# Patient Record
Sex: Female | Born: 1955
Health system: Southern US, Community
[De-identification: ages and names within clinical notes are randomized; demographics above are authoritative.]

## PROBLEM LIST (undated history)

## (undated) DIAGNOSIS — M1990 Unspecified osteoarthritis, unspecified site: Secondary | ICD-10-CM

## (undated) DIAGNOSIS — R079 Chest pain, unspecified: Secondary | ICD-10-CM

## (undated) DIAGNOSIS — E7800 Pure hypercholesterolemia, unspecified: Secondary | ICD-10-CM

## (undated) DIAGNOSIS — Z973 Presence of spectacles and contact lenses: Secondary | ICD-10-CM

## (undated) DIAGNOSIS — K5792 Diverticulitis of intestine, part unspecified, without perforation or abscess without bleeding: Secondary | ICD-10-CM

## (undated) DIAGNOSIS — Z87440 Personal history of urinary (tract) infections: Secondary | ICD-10-CM

## (undated) DIAGNOSIS — I10 Essential (primary) hypertension: Secondary | ICD-10-CM

## (undated) DIAGNOSIS — E785 Hyperlipidemia, unspecified: Secondary | ICD-10-CM

## (undated) HISTORY — DX: Hyperlipidemia, unspecified: E78.5

## (undated) HISTORY — DX: Diverticulitis of intestine, part unspecified, without perforation or abscess without bleeding: K57.92

## (undated) HISTORY — DX: Essential (primary) hypertension: I10

## (undated) HISTORY — PX: FOOT SURGERY: SHX648

## (undated) HISTORY — DX: Chest pain, unspecified: R07.9

## (undated) HISTORY — PX: TONSILLECTOMY: SUR1361

## (undated) HISTORY — DX: Pure hypercholesterolemia, unspecified: E78.00

---

## 2008-07-15 HISTORY — PX: COLONOSCOPY: SHX174

## 2011-12-29 ENCOUNTER — Other Ambulatory Visit (HOSPITAL_COMMUNITY): Payer: Self-pay | Admitting: Internal Medicine

## 2011-12-29 DIAGNOSIS — Z1239 Encounter for other screening for malignant neoplasm of breast: Secondary | ICD-10-CM

## 2012-01-01 ENCOUNTER — Ambulatory Visit (HOSPITAL_COMMUNITY)
Admission: RE | Admit: 2012-01-01 | Discharge: 2012-01-01 | Disposition: A | Discharge status: Home / Self Care | Payer: 59 | Source: Ambulatory Visit | Attending: Internal Medicine | Admitting: Internal Medicine

## 2012-01-01 DIAGNOSIS — Z1231 Encounter for screening mammogram for malignant neoplasm of breast: Secondary | ICD-10-CM | POA: Insufficient documentation

## 2012-01-01 DIAGNOSIS — Z1239 Encounter for other screening for malignant neoplasm of breast: Secondary | ICD-10-CM

## 2012-12-29 ENCOUNTER — Other Ambulatory Visit (HOSPITAL_COMMUNITY): Payer: Self-pay | Admitting: Internal Medicine

## 2012-12-29 DIAGNOSIS — Z139 Encounter for screening, unspecified: Secondary | ICD-10-CM

## 2013-01-06 ENCOUNTER — Ambulatory Visit (HOSPITAL_COMMUNITY)
Admission: RE | Admit: 2013-01-06 | Discharge: 2013-01-06 | Disposition: A | Discharge status: Home / Self Care | Payer: BC Managed Care – PPO | Source: Ambulatory Visit | Attending: Internal Medicine | Admitting: Internal Medicine

## 2013-01-06 DIAGNOSIS — Z1231 Encounter for screening mammogram for malignant neoplasm of breast: Secondary | ICD-10-CM | POA: Insufficient documentation

## 2013-01-06 DIAGNOSIS — Z139 Encounter for screening, unspecified: Secondary | ICD-10-CM

## 2013-01-11 ENCOUNTER — Other Ambulatory Visit: Payer: Self-pay | Admitting: Internal Medicine

## 2013-01-11 DIAGNOSIS — R928 Other abnormal and inconclusive findings on diagnostic imaging of breast: Secondary | ICD-10-CM

## 2013-02-02 ENCOUNTER — Ambulatory Visit (HOSPITAL_COMMUNITY)
Admission: RE | Admit: 2013-02-02 | Discharge: 2013-02-02 | Disposition: A | Discharge status: Home / Self Care | Payer: BC Managed Care – PPO | Source: Ambulatory Visit | Attending: Internal Medicine | Admitting: Internal Medicine

## 2013-02-02 DIAGNOSIS — R928 Other abnormal and inconclusive findings on diagnostic imaging of breast: Secondary | ICD-10-CM | POA: Insufficient documentation

## 2013-06-13 ENCOUNTER — Encounter (HOSPITAL_COMMUNITY): Payer: Self-pay

## 2013-06-13 ENCOUNTER — Other Ambulatory Visit (HOSPITAL_COMMUNITY): Payer: Self-pay | Admitting: Internal Medicine

## 2013-06-13 ENCOUNTER — Ambulatory Visit (HOSPITAL_COMMUNITY)
Admission: RE | Admit: 2013-06-13 | Discharge: 2013-06-13 | Disposition: A | Discharge status: Home / Self Care | Payer: 59 | Source: Ambulatory Visit | Attending: Internal Medicine | Admitting: Internal Medicine

## 2013-06-13 DIAGNOSIS — R110 Nausea: Secondary | ICD-10-CM | POA: Insufficient documentation

## 2013-06-13 DIAGNOSIS — R109 Unspecified abdominal pain: Secondary | ICD-10-CM | POA: Insufficient documentation

## 2013-06-13 DIAGNOSIS — K5732 Diverticulitis of large intestine without perforation or abscess without bleeding: Secondary | ICD-10-CM | POA: Insufficient documentation

## 2013-06-13 HISTORY — DX: Essential (primary) hypertension: I10

## 2013-06-13 MED ORDER — IOHEXOL 300 MG/ML  SOLN
100.0000 mL | Freq: Once | INTRAMUSCULAR | Status: AC | PRN
  Administered 2013-06-13: 100 mL via INTRAVENOUS

## 2013-08-22 ENCOUNTER — Other Ambulatory Visit (HOSPITAL_COMMUNITY): Payer: Self-pay | Admitting: Internal Medicine

## 2013-08-22 DIAGNOSIS — R928 Other abnormal and inconclusive findings on diagnostic imaging of breast: Secondary | ICD-10-CM

## 2013-08-24 ENCOUNTER — Ambulatory Visit (HOSPITAL_COMMUNITY)
Admission: RE | Admit: 2013-08-24 | Discharge: 2013-08-24 | Disposition: A | Discharge status: Home / Self Care | Payer: 59 | Source: Ambulatory Visit | Attending: Internal Medicine | Admitting: Internal Medicine

## 2013-08-24 DIAGNOSIS — R928 Other abnormal and inconclusive findings on diagnostic imaging of breast: Secondary | ICD-10-CM | POA: Insufficient documentation

## 2014-01-09 ENCOUNTER — Other Ambulatory Visit (HOSPITAL_COMMUNITY): Payer: Self-pay | Admitting: Internal Medicine

## 2014-01-09 DIAGNOSIS — Z09 Encounter for follow-up examination after completed treatment for conditions other than malignant neoplasm: Secondary | ICD-10-CM

## 2014-02-07 ENCOUNTER — Ambulatory Visit (HOSPITAL_COMMUNITY)
Admission: RE | Admit: 2014-02-07 | Discharge: 2014-02-07 | Disposition: A | Discharge status: Home / Self Care | Payer: 59 | Source: Ambulatory Visit | Attending: Internal Medicine | Admitting: Internal Medicine

## 2014-02-07 DIAGNOSIS — Z09 Encounter for follow-up examination after completed treatment for conditions other than malignant neoplasm: Secondary | ICD-10-CM

## 2014-02-07 DIAGNOSIS — R928 Other abnormal and inconclusive findings on diagnostic imaging of breast: Secondary | ICD-10-CM | POA: Insufficient documentation

## 2014-10-16 ENCOUNTER — Encounter: Payer: Self-pay | Admitting: Internal Medicine

## 2014-11-09 ENCOUNTER — Ambulatory Visit (INDEPENDENT_AMBULATORY_CARE_PROVIDER_SITE_OTHER): Payer: 59 | Admitting: Gastroenterology

## 2014-11-09 ENCOUNTER — Encounter: Payer: Self-pay | Admitting: Gastroenterology

## 2014-11-09 ENCOUNTER — Other Ambulatory Visit: Payer: Self-pay

## 2014-11-09 ENCOUNTER — Telehealth: Payer: Self-pay | Admitting: Internal Medicine

## 2014-11-09 VITALS — BP 125/75 | HR 75 | Temp 98.4°F | Ht 62.0 in | Wt 136.0 lb

## 2014-11-09 DIAGNOSIS — K5732 Diverticulitis of large intestine without perforation or abscess without bleeding: Secondary | ICD-10-CM

## 2014-11-09 DIAGNOSIS — Z8719 Personal history of other diseases of the digestive system: Secondary | ICD-10-CM

## 2014-11-09 DIAGNOSIS — R140 Abdominal distension (gaseous): Secondary | ICD-10-CM | POA: Diagnosis not present

## 2014-11-09 MED ORDER — PEG 3350-KCL-NA BICARB-NACL 420 G PO SOLR: 4000.0000 mL | Freq: Once | ORAL | Status: DC

## 2014-11-09 NOTE — Telephone Encounter (Signed)
Patient was seen today by AS and AS had requested for me to get last colonoscopy report from Dr Jodelle Green in [place] which was done in May 2010. I faxed release and they faxed a reply back that records were too old and off site.

## 2014-11-09 NOTE — Progress Notes (Signed)
Primary Care Physician:  Delphina Cahill, MD Primary Gastroenterologist:  Dr. Gala Romney   Chief Complaint  Patient presents with  . Diverticulitis    HPI:   Elizabeth Li is a 59 y.o. female presenting today at the request of Dr. Nevada Crane secondary to history of diverticulitis.  Notes occasional RLQ discomfort but not doubled over like she was before. Last year March 2015 felt horrible, took Copywriter, advertising, ambien, ibuprofen. Pain continued, so CT was ordered by PCP. CT revealed acute diverticulitis of mid descending colon. Always feels bloated. Feels like she has a bump in lower abdomen. [facility] Started protein shakes in the morning to try and decrease feeling of bloating. History of chronic constipation. Makes a concoction of prunes, raisins, senna powder, with good results every day but has to take every day. Doesn't want to try a prescription medication. Last colonoscopy May 2010 in [place]. Possible polyps removed at that time. No FH of colon cancer. No rectal bleeding. No unexplained weight loss, lack of appetite.  Past Medical History  Diagnosis Date  . Hypertension   . Diverticulitis   . Hypercholesterolemia   . Anxiety     Past Surgical History  Procedure Laterality Date  . Colonoscopy  07/15/2008    polyps-done in [place]    Current Outpatient Prescriptions  Medication Sig Dispense Refill  . FLUoxetine (PROZAC) 20 MG capsule Take 20 mg by mouth daily.    Marland Kitchen losartan (COZAAR) 100 MG tablet Take 100 mg by mouth daily.    . simvastatin (ZOCOR) 40 MG tablet Take 40 mg by mouth daily.     No current facility-administered medications for this visit.    Allergies as of 11/09/2014  . (No Known Allergies)    Family History  Problem Relation Age of Onset  . Colon cancer Neg Hx     Social History   Social History  . Marital Status: Married    Spouse Name: N/A  . Number of Children: N/A  . Years of Education: N/A    Occupational History  . [employer]   Social History Main Topics  . Smoking status: Never Smoker   . Smokeless tobacco: Not on file  . Alcohol Use: 0.0 oz/week    0 Standard drinks or equivalent per week     Comment: occ  . Drug Use: No  . Sexual Activity: Not on file   Other Topics Concern  . Not on file   Social History Narrative    Review of Systems: Gen: Denies any fever, chills, fatigue, weight loss, lack of appetite.  CV: Denies chest pain, heart palpitations, peripheral edema, syncope.  Resp: Denies shortness of breath at rest or with exertion. Denies wheezing or cough.  GI: As mentioned in HPI GU : Denies urinary burning, urinary frequency, urinary hesitancy MS: Denies joint pain, muscle weakness, cramps, or limitation of movement.  Derm: Denies rash, itching, dry skin Psych: Denies depression, anxiety, memory loss, and confusion Heme: Denies bruising, bleeding, and enlarged lymph nodes.  Physical Exam: BP 125/75 mmHg  Pulse 75  Temp(Src) 98.4 F (36.9 C) (Oral)  Ht 5\' 2"  (1.575 m)  Wt 136 lb (61.689 kg)  BMI 24.87 kg/m2 General:   Alert and oriented. Pleasant and cooperative. Well-nourished and well-developed.  Head:  Normocephalic and atraumatic. Eyes:  Without icterus, sclera clear and conjunctiva pink.  Ears:  Normal auditory acuity. Nose:  No  deformity, discharge,  or lesions. Mouth:  No deformity or lesions, oral mucosa pink.  Lungs:  Clear to auscultation bilaterally. No wheezes, rales, or rhonchi. No distress.  Heart:  S1, S2 present without murmurs appreciated.  Abdomen:  +BS, soft, non-tender and non-distended. No HSM noted. No guarding or rebound. No masses appreciated.  Rectal:  Deferred  Msk:  Symmetrical without gross deformities. Normal posture. Extremities:  Without  edema. Neurologic:  Alert and  oriented x4;  grossly normal neurologically. Skin:  Intact without significant lesions or rashes. Psych:  Alert and  cooperative. Normal mood and affect.

## 2014-11-09 NOTE — Patient Instructions (Addendum)
We have scheduled you for a colonoscopy with Dr. Gala Romney in the near future.  Start taking a probiotic daily such as Align, [place], [place], Digestive Advantage. I have provided samples.  Please review the bloating handout. Try to eliminate some of these items to see if anything makes a difference.  I am glad you are seeing the gynecologist next month. Bloating Bloating is the feeling of fullness in your belly. You may feel as though your pants are too tight. Often the cause of bloating is overeating, retaining fluids, or having gas in your bowel. It is also caused by swallowing air and eating foods that cause gas. Irritable bowel syndrome is one of the most common causes of bloating. Constipation is also a common cause. Sometimes more serious problems can cause bloating. SYMPTOMS  Usually there is a feeling of fullness, as though your abdomen is bulged out. There may be mild discomfort.  DIAGNOSIS  Usually no particular testing is necessary for most bloating. If the condition persists and seems to become worse, your caregiver may do additional testing.  TREATMENT   There is no direct treatment for bloating.  Do not put gas into the bowel. Avoid chewing gum and sucking on candy. These tend to make you swallow air. Swallowing air can also be a nervous habit. Try to avoid this.  Avoiding high residue diets will help. Eat foods with soluble fibers (examples include root vegetables, apples, or barley) and substitute dairy products with soy and rice products. This helps irritable bowel syndrome.  If constipation is the cause, then a high residue diet with more fiber will help.  Avoid carbonated beverages.  Over-the-counter preparations are available that help reduce gas. Your pharmacist can help you with this. SEEK MEDICAL CARE IF:   Bloating continues and seems to be getting worse.  You notice a weight gain.  You have a weight loss but the bloating is getting worse.  You  have changes in your bowel habits or develop nausea or vomiting. SEEK IMMEDIATE MEDICAL CARE IF:   You develop shortness of breath or swelling in your legs.  You have an increase in abdominal pain or develop chest pain. Document Released: 01/01/2006 Document Revised: 05/26/2011 Document Reviewed: 02/19/2007 [facility] Patient Information 2015 [place], [place]. This information is not intended to replace advice given to you by your health care provider. Make sure you discuss any questions you have with your health care provider.

## 2014-11-09 NOTE — Telephone Encounter (Signed)
Routing to AS 

## 2014-11-10 NOTE — Telephone Encounter (Signed)
Noted. Thanks.

## 2014-11-15 NOTE — Assessment & Plan Note (Signed)
59 year old female with history of uncomplicated diverticulitis March 2015, with last colonoscopy 2010 in [place]. Per patient, possible polyps removed at that time. Procedure notes not available at time of visit. Needs colonoscopy to exclude any occult malignancy, although this is less likely.   Proceed with TCS with Dr. Gala Romney in near future: the risks, benefits, and alternatives have been discussed with the patient in detail. The patient states understanding and desires to proceed.

## 2014-11-15 NOTE — Assessment & Plan Note (Signed)
Vague bloating, feels a "bump" in her abdomen but nothing appreciated on physical exam. CT on file from March 1464 with uncomplicated diverticulitis. Likely constipation playing a role but declining any prescriptive agents. Will trial a probiotic daily. Strongly recommend keeping upcoming appt with GYN to exclude any gynecological process. Colonoscopy as planned.

## 2014-11-16 NOTE — Progress Notes (Signed)
CC'ED TO PCP 

## 2014-11-24 ENCOUNTER — Encounter (HOSPITAL_COMMUNITY)
Admission: RE | Disposition: A | Discharge status: Home / Self Care | Payer: Self-pay | Source: Ambulatory Visit | Attending: Internal Medicine

## 2014-11-24 ENCOUNTER — Encounter (HOSPITAL_COMMUNITY): Payer: Self-pay | Admitting: *Deleted

## 2014-11-24 ENCOUNTER — Ambulatory Visit (HOSPITAL_COMMUNITY)
Admission: RE | Admit: 2014-11-24 | Discharge: 2014-11-24 | Disposition: A | Discharge status: Home / Self Care | Payer: 59 | Source: Ambulatory Visit | Attending: Internal Medicine | Admitting: Internal Medicine

## 2014-11-24 DIAGNOSIS — K5730 Diverticulosis of large intestine without perforation or abscess without bleeding: Secondary | ICD-10-CM | POA: Diagnosis not present

## 2014-11-24 DIAGNOSIS — Z86010 Personal history of colonic polyps: Secondary | ICD-10-CM | POA: Insufficient documentation

## 2014-11-24 DIAGNOSIS — F419 Anxiety disorder, unspecified: Secondary | ICD-10-CM | POA: Insufficient documentation

## 2014-11-24 DIAGNOSIS — E780 Pure hypercholesterolemia: Secondary | ICD-10-CM | POA: Diagnosis not present

## 2014-11-24 DIAGNOSIS — Z79899 Other long term (current) drug therapy: Secondary | ICD-10-CM | POA: Insufficient documentation

## 2014-11-24 DIAGNOSIS — K6389 Other specified diseases of intestine: Secondary | ICD-10-CM | POA: Diagnosis not present

## 2014-11-24 DIAGNOSIS — I10 Essential (primary) hypertension: Secondary | ICD-10-CM | POA: Diagnosis not present

## 2014-11-24 DIAGNOSIS — R933 Abnormal findings on diagnostic imaging of other parts of digestive tract: Secondary | ICD-10-CM | POA: Diagnosis present

## 2014-11-24 DIAGNOSIS — Z8719 Personal history of other diseases of the digestive system: Secondary | ICD-10-CM

## 2014-11-24 HISTORY — PX: COLONOSCOPY: SHX5424

## 2014-11-24 SURGERY — COLONOSCOPY
Anesthesia: Moderate Sedation

## 2014-11-24 MED ORDER — STERILE WATER FOR IRRIGATION IR SOLN
Status: DC | PRN
  Administered 2014-11-24: 15:00:00

## 2014-11-24 MED ORDER — MIDAZOLAM HCL 5 MG/5ML IJ SOLN
INTRAMUSCULAR | Status: AC
  Filled 2014-11-24: qty 10

## 2014-11-24 MED ORDER — SODIUM CHLORIDE 0.9 % IV SOLN
INTRAVENOUS | Status: DC
  Administered 2014-11-24: 14:00:00 via INTRAVENOUS

## 2014-11-24 MED ORDER — ONDANSETRON HCL 4 MG/2ML IJ SOLN
INTRAMUSCULAR | Status: DC | PRN
  Administered 2014-11-24: 4 mg via INTRAVENOUS

## 2014-11-24 MED ORDER — MEPERIDINE HCL 100 MG/ML IJ SOLN
INTRAMUSCULAR | Status: DC | PRN
  Administered 2014-11-24 (×2): 25 mg via INTRAVENOUS
  Administered 2014-11-24: 50 mg via INTRAVENOUS

## 2014-11-24 MED ORDER — ONDANSETRON HCL 4 MG/2ML IJ SOLN
INTRAMUSCULAR | Status: AC
  Filled 2014-11-24: qty 2

## 2014-11-24 MED ORDER — MEPERIDINE HCL 100 MG/ML IJ SOLN
INTRAMUSCULAR | Status: AC
  Filled 2014-11-24: qty 2

## 2014-11-24 MED ORDER — MIDAZOLAM HCL 5 MG/5ML IJ SOLN
INTRAMUSCULAR | Status: DC | PRN
  Administered 2014-11-24 (×2): 1 mg via INTRAVENOUS
  Administered 2014-11-24: 2 mg via INTRAVENOUS

## 2014-11-24 NOTE — Discharge Instructions (Addendum)
Colonoscopy Discharge Instructions  Read the instructions outlined below and refer to this sheet in the next few weeks. These discharge instructions provide you with general information on caring for yourself after you leave the hospital. Your doctor may also give you specific instructions. While your treatment has been planned according to the most current medical practices available, unavoidable complications occasionally occur. If you have any problems or questions after discharge, call Dr. Gala Romney at [phone]. ACTIVITY  You may resume your regular activity, but move at a slower pace for the next 24 hours.   Take frequent rest periods for the next 24 hours.   Walking will help get rid of the air and reduce the bloated feeling in your belly (abdomen).   No driving for 24 hours (because of the medicine (anesthesia) used during the test).    Do not sign any important legal documents or operate any machinery for 24 hours (because of the anesthesia used during the test).  NUTRITION  Drink plenty of fluids.   You may resume your normal diet as instructed by your doctor.   Begin with a light meal and progress to your normal diet. Heavy or fried foods are harder to digest and may make you feel sick to your stomach (nauseated).   Avoid alcoholic beverages for 24 hours or as instructed.  MEDICATIONS  You may resume your normal medications unless your doctor tells you otherwise.  WHAT YOU CAN EXPECT TODAY  Some feelings of bloating in the abdomen.   Passage of more gas than usual.   Spotting of blood in your stool or on the toilet paper.  IF YOU HAD POLYPS REMOVED DURING THE COLONOSCOPY:  No aspirin products for 7 days or as instructed.   No alcohol for 7 days or as instructed.   Eat a soft diet for the next 24 hours.  FINDING OUT THE RESULTS OF YOUR TEST Not all test results are available during your visit. If your test results are not back during the visit, make an appointment  with your caregiver to find out the results. Do not assume everything is normal if you have not heard from your caregiver or the medical facility. It is important for you to follow up on all of your test results.  SEEK IMMEDIATE MEDICAL ATTENTION IF:  You have more than a spotting of blood in your stool.   Your belly is swollen (abdominal distention).   You are nauseated or vomiting.   You have a temperature over 101.   You have abdominal pain or discomfort that is severe or gets worse throughout the day.     Diverticulosis and constipation information provided  May continue your home remedy daily for bowel function  Would recommend taking a probiotic daily  Try MiraLAX 17 g orally at bedtime 3 times weekly as needed to assess with bloating/bowel evacuation  Repeat colonoscopy in 5 years     Diverticulosis Diverticulosis is the condition that develops when small pouches (diverticula) form in the wall of your colon. Your colon, or large intestine, is where water is absorbed and stool is formed. The pouches form when the inside layer of your colon pushes through weak spots in the outer layers of your colon. CAUSES  No one knows exactly what causes diverticulosis. RISK FACTORS  Being older than 54. Your risk for this condition increases with age. Diverticulosis is rare in people younger than 40 years. By age 59, almost everyone has it.  Eating a low-fiber diet.  Being frequently constipated.  Being overweight.  Not getting enough exercise.  Smoking.  Taking over-the-counter pain medicines, like aspirin and ibuprofen. SYMPTOMS  Most people with diverticulosis do not have symptoms. DIAGNOSIS  Because diverticulosis often has no symptoms, health care providers often discover the condition during an exam for other colon problems. In many cases, a health care provider will diagnose diverticulosis while using a flexible scope to examine the colon (colonoscopy). TREATMENT    If you have never developed an infection related to diverticulosis, you may not need treatment. If you have had an infection before, treatment may include:  Eating more fruits, vegetables, and grains.  Taking a fiber supplement.  Taking a live bacteria supplement (probiotic).  Taking medicine to relax your colon. HOME CARE INSTRUCTIONS   Drink at least 6-8 glasses of water each day to prevent constipation.  Try not to strain when you have a bowel movement.  Keep all follow-up appointments. If you have had an infection before:  Increase the fiber in your diet as directed by your health care provider or dietitian.  Take a dietary fiber supplement if your health care provider approves.  Only take medicines as directed by your health care provider. SEEK MEDICAL CARE IF:   You have abdominal pain.  You have bloating.  You have cramps.  You have not gone to the bathroom in 3 days. SEEK IMMEDIATE MEDICAL CARE IF:   Your pain gets worse.  Yourbloating becomes very bad.  You have a fever or chills, and your symptoms suddenly get worse.  You begin vomiting.  You have bowel movements that are bloody or black. MAKE SURE YOU:  Understand these instructions.  Will watch your condition.  Will get help right away if you are not doing well or get worse. Document Released: 11/29/2003 Document Revised: 03/08/2013 Document Reviewed: 01/26/2013 [facility] Patient Information 2015 [place], [place]. This information is not intended to replace advice given to you by your health care provider. Make sure you discuss any questions you have with your health care provider.  Constipation Constipation is when a person has fewer than three bowel movements a week, has difficulty having a bowel movement, or has stools that are dry, hard, or larger than normal. As people grow older, constipation is more common. If you try to fix constipation with medicines that make you have a bowel movement  (laxatives), the problem may get worse. Long-term laxative use may cause the muscles of the colon to become weak. A low-fiber diet, not taking in enough fluids, and taking certain medicines may make constipation worse.  CAUSES   Certain medicines, such as antidepressants, pain medicine, iron supplements, antacids, and water pills.   Certain diseases, such as diabetes, irritable bowel syndrome (IBS), thyroid disease, or depression.   Not drinking enough water.   Not eating enough fiber-rich foods.   Stress or travel.   Lack of physical activity or exercise.   Ignoring the urge to have a bowel movement.   Using laxatives too much.  SIGNS AND SYMPTOMS   Having fewer than three bowel movements a week.   Straining to have a bowel movement.   Having stools that are hard, dry, or larger than normal.   Feeling full or bloated.   Pain in the lower abdomen.   Not feeling relief after having a bowel movement.  DIAGNOSIS  Your health care provider will take a medical history and perform a physical exam. Further testing may be done for severe constipation. Some tests  may include:  A barium enema X-ray to examine your rectum, colon, and, sometimes, your small intestine.   A sigmoidoscopy to examine your lower colon.   A colonoscopy to examine your entire colon. TREATMENT  Treatment will depend on the severity of your constipation and what is causing it. Some dietary treatments include drinking more fluids and eating more fiber-rich foods. Lifestyle treatments may include regular exercise. If these diet and lifestyle recommendations do not help, your health care provider may recommend taking over-the-counter laxative medicines to help you have bowel movements. Prescription medicines may be prescribed if over-the-counter medicines do not work.  HOME CARE INSTRUCTIONS   Eat foods that have a lot of fiber, such as fruits, vegetables, whole grains, and beans.  Limit foods  high in fat and processed sugars, such as french fries, hamburgers, cookies, candies, and soda.   A fiber supplement may be added to your diet if you cannot get enough fiber from foods.   Drink enough fluids to keep your urine clear or pale yellow.   Exercise regularly or as directed by your health care provider.   Go to the restroom when you have the urge to go. Do not hold it.   Only take over-the-counter or prescription medicines as directed by your health care provider. Do not take other medicines for constipation without talking to your health care provider first.  [place] IF:   You have bright red blood in your stool.   Your constipation lasts for more than 4 days or gets worse.   You have abdominal or rectal pain.   You have thin, pencil-like stools.   You have unexplained weight loss. MAKE SURE YOU:   Understand these instructions.  Will watch your condition.  Will get help right away if you are not doing well or get worse. Document Released: 11/30/2003 Document Revised: 03/08/2013 Document Reviewed: 12/13/2012 [facility] Patient Information 2015 [place], [place]. This information is not intended to replace advice given to you by your health care provider. Make sure you discuss any questions you have with your health care provider.

## 2014-11-24 NOTE — H&P (View-Only) (Signed)
    Primary Care Physician:  HALL, ZACH, MD Primary Gastroenterologist:  Dr. Rourk   Chief Complaint  Patient presents with  . Diverticulitis    HPI:   Elizabeth Li is a 59 y.o. female presenting today at the request of Dr. Hall secondary to history of diverticulitis.  Notes occasional RLQ discomfort but not doubled over like she was before. Last year March 2015 felt horrible, took alka seltzer, ambien, ibuprofen. Pain continued, so CT was ordered by PCP. CT revealed acute diverticulitis of mid descending colon. Always feels bloated. Feels like she has a bump in lower abdomen. [facility] Started protein shakes in the morning to try and decrease feeling of bloating. History of chronic constipation. Makes a concoction of prunes, raisins, senna powder, with good results every day but has to take every day. Doesn't want to try a prescription medication. Last colonoscopy May 2010 in [place]. Possible polyps removed at that time. No FH of colon cancer. No rectal bleeding. No unexplained weight loss, lack of appetite.  Past Medical History  Diagnosis Date  . Hypertension   . Diverticulitis   . Hypercholesterolemia   . Anxiety     Past Surgical History  Procedure Laterality Date  . Colonoscopy  07/15/2008    polyps-done in [place]    Current Outpatient Prescriptions  Medication Sig Dispense Refill  . FLUoxetine (PROZAC) 20 MG capsule Take 20 mg by mouth daily.    . losartan (COZAAR) 100 MG tablet Take 100 mg by mouth daily.    . simvastatin (ZOCOR) 40 MG tablet Take 40 mg by mouth daily.     No current facility-administered medications for this visit.    Allergies as of 11/09/2014  . (No Known Allergies)    Family History  Problem Relation Age of Onset  . Colon cancer Neg Hx     Social History   Social History  . Marital Status: Married    Spouse Name: N/A  . Number of Children: N/A  . Years of Education: N/A    Occupational History  . 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 Smoking status: Never Smoker   . Smokeless tobacco: Not on file  . Alcohol Use: 0.0 oz/week    0 Standard drinks or equivalent per week     Comment: occ  . Drug Use: No  . Sexual Activity: Not on file   Other Topics Concern  . Not on file   Social History Narrative    Review of Systems: Gen: Denies any fever, chills, fatigue, weight loss, lack of appetite.  CV: Denies chest pain, heart palpitations, peripheral edema, syncope.  Resp: Denies shortness of breath at rest or with exertion. Denies wheezing or cough.  GI: As mentioned in HPI GU : Denies urinary burning, urinary frequency, urinary hesitancy MS: Denies joint pain, muscle weakness, cramps, or limitation of movement.  Derm: Denies rash, itching, dry skin Psych: Denies depression, anxiety, memory loss, and confusion Heme: Denies bruising, bleeding, and enlarged lymph nodes.  Physical Exam: BP 125/75 mmHg  Pulse 75  Temp(Src) 98.4 F (36.9 C) (Oral)  Ht 5' 2" (1.575 m)  Wt 136 lb (61.689 kg)  BMI 24.87 kg/m2 General:   Alert and oriented. Pleasant and cooperative. Well-nourished and well-developed.  Head:  Normocephalic and atraumatic. Eyes:  Without icterus, sclera clear and conjunctiva pink.  Ears:  Normal auditory acuity. Nose:  No   deformity, discharge,  or lesions. Mouth:  No deformity or lesions, oral mucosa pink.  Lungs:  Clear to auscultation bilaterally. No wheezes, rales, or rhonchi. No distress.  Heart:  S1, S2 present without murmurs appreciated.  Abdomen:  +BS, soft, non-tender and non-distended. No HSM noted. No guarding or rebound. No masses appreciated.  Rectal:  Deferred  Msk:  Symmetrical without gross deformities. Normal posture. Extremities:  Without  edema. Neurologic:  Alert and  oriented x4;  grossly normal neurologically. Skin:  Intact without significant lesions or rashes. Psych:  Alert and  cooperative. Normal mood and affect.    

## 2014-11-24 NOTE — Op Note (Signed)
[place], [id number]   COLONOSCOPY PROCEDURE REPORT  PATIENT: Elizabeth Li, Elizabeth Li  MR#: [id number] BIRTHDATE: 29-Aug-1955 , 29  yrs. old GENDER: female ENDOSCOPIST: R.  Garfield Cornea, MD FACP [facility] REFERRED VX:BLTJ Hall, M.D. PROCEDURE DATE:  2014/12/19 PROCEDURE:   Colonoscopy, diagnostic INDICATIONS:Abnormal colon on CT suggestive of diverticulitis; history of colonic polyps. MEDICATIONS: Versed 4 mg IV and Demerol 100 mg IV in divided doses. Zofran 4 mg IV. ASA CLASS:       Class II  CONSENT: The risks, benefits, alternatives and imponderables including but not limited to bleeding, perforation as well as the possibility of a missed lesion have been reviewed.  The potential for biopsy, lesion removal, etc. have also been discussed. Questions have been answered.  All parties agreeable.  Please see the history and physical in the medical record for more information.  DESCRIPTION OF PROCEDURE:   After the risks benefits and alternatives of the procedure were thoroughly explained, informed consent was obtained.  The digital rectal exam revealed no abnormalities of the rectum.   The EC-[id number] ([id number])  endoscope was introduced through the anus and advanced to the cecum, which was identified by both the appendix and ileocecal valve. No adverse events experienced.   The quality of the prep was adequate  The instrument was then slowly withdrawn as the colon was fully examined. Estimated blood loss is zero unless otherwise noted in this procedure report.      COLON FINDINGS: Normal-appearing rectal mucosa aside from pigmentation consistent with melanosis coli.  Scattered left-sided diverticula.  Diffusely pigmented colonic mucosa consistent with melanosis coli; otherwise the colonic mucosa appeared normal. Retroflexion was performed. .  Withdrawal time=12 minutes 0 seconds.  The scope was withdrawn and the procedure  completed. COMPLICATIONS: There were no immediate complications.  ENDOSCOPIC IMPRESSION: Colonic diverticulosis. Melanosis coli.  RECOMMENDATIONS: May continue daily home remedy for bowel function. Would consider adding a probiotic daily. Would consider also using MiraLAX 17 g orally at bedtime 3 times weekly or so as needed to facilitate bowel evacuation/management of bloating.  Repeat colonoscopy in 5 years.  eSigned:  R. Garfield Cornea, MD Rosalita Chessman [facility] 19-Dec-2014 3:25 PM   cc:  CPT CODES: ICD CODES:  The ICD and CPT codes recommended by this software are interpretations from the data that the clinical staff has captured with the software.  The verification of the translation of this report to the ICD and CPT codes and modifiers is the sole responsibility of the health care institution and practicing physician where this report was generated.  [employer] will not be held responsible for the validity of the ICD and CPT codes included on this report.  AMA assumes no liability for data contained or not contained herein. CPT is a Designer, television/film set of the [employer].  PATIENT NAME:  Elizabeth Li, Elizabeth Li MR#: [id number]

## 2014-11-24 NOTE — Interval H&P Note (Signed)
History and Physical Interval Note:  11/24/2014 2:33 PM  Elizabeth Li  has presented today for surgery, with the diagnosis of history of diverticuliits  The various methods of treatment have been discussed with the patient and family. After consideration of risks, benefits and other options for treatment, the patient has consented to  Procedure(s) with comments: COLONOSCOPY (N/A) - 1430-moved to [address] to notify pt  as a surgical intervention .  The patient's history has been reviewed, patient examined, no change in status, stable for surgery.  I have reviewed the patient's chart and labs.  Questions were answered to the patient's satisfaction.     Elizabeth Li  No change. Diagnostic colonoscopy per plan.The risks, benefits, limitations, alternatives and imponderables have been reviewed with the patient. Questions have been answered. All parties are agreeable.

## 2014-11-28 ENCOUNTER — Encounter (HOSPITAL_COMMUNITY): Payer: Self-pay | Admitting: Internal Medicine

## 2015-01-30 ENCOUNTER — Other Ambulatory Visit (HOSPITAL_COMMUNITY): Payer: Self-pay | Admitting: Internal Medicine

## 2015-01-30 DIAGNOSIS — IMO0002 Reserved for concepts with insufficient information to code with codable children: Secondary | ICD-10-CM

## 2015-01-30 DIAGNOSIS — Z1231 Encounter for screening mammogram for malignant neoplasm of breast: Secondary | ICD-10-CM

## 2015-02-05 ENCOUNTER — Other Ambulatory Visit (HOSPITAL_COMMUNITY): Payer: Self-pay | Admitting: Internal Medicine

## 2015-02-05 DIAGNOSIS — R922 Inconclusive mammogram: Secondary | ICD-10-CM

## 2015-02-05 DIAGNOSIS — Z09 Encounter for follow-up examination after completed treatment for conditions other than malignant neoplasm: Secondary | ICD-10-CM

## 2015-02-13 ENCOUNTER — Ambulatory Visit (HOSPITAL_COMMUNITY)
Admission: RE | Admit: 2015-02-13 | Discharge: 2015-02-13 | Disposition: A | Discharge status: Home / Self Care | Payer: 59 | Source: Ambulatory Visit | Attending: Internal Medicine | Admitting: Internal Medicine

## 2015-02-13 DIAGNOSIS — N6489 Other specified disorders of breast: Secondary | ICD-10-CM | POA: Diagnosis not present

## 2015-02-13 DIAGNOSIS — Z09 Encounter for follow-up examination after completed treatment for conditions other than malignant neoplasm: Secondary | ICD-10-CM | POA: Diagnosis not present

## 2015-02-13 DIAGNOSIS — R922 Inconclusive mammogram: Secondary | ICD-10-CM | POA: Insufficient documentation

## 2015-02-21 ENCOUNTER — Other Ambulatory Visit: Payer: Self-pay | Admitting: Orthopedic Surgery

## 2015-02-22 ENCOUNTER — Encounter (HOSPITAL_BASED_OUTPATIENT_CLINIC_OR_DEPARTMENT_OTHER): Payer: Self-pay | Admitting: *Deleted

## 2015-02-23 ENCOUNTER — Encounter (HOSPITAL_BASED_OUTPATIENT_CLINIC_OR_DEPARTMENT_OTHER)
Admission: RE | Admit: 2015-02-23 | Discharge: 2015-02-23 | Disposition: A | Discharge status: Home / Self Care | Payer: 59 | Source: Ambulatory Visit | Attending: Orthopedic Surgery | Admitting: Orthopedic Surgery

## 2015-02-23 ENCOUNTER — Other Ambulatory Visit: Payer: Self-pay

## 2015-02-23 DIAGNOSIS — M65331 Trigger finger, right middle finger: Secondary | ICD-10-CM | POA: Diagnosis present

## 2015-02-23 DIAGNOSIS — F419 Anxiety disorder, unspecified: Secondary | ICD-10-CM | POA: Diagnosis not present

## 2015-02-23 DIAGNOSIS — Z888 Allergy status to other drugs, medicaments and biological substances status: Secondary | ICD-10-CM | POA: Diagnosis not present

## 2015-02-23 DIAGNOSIS — E7800 Pure hypercholesterolemia, unspecified: Secondary | ICD-10-CM | POA: Diagnosis not present

## 2015-02-23 DIAGNOSIS — Z01818 Encounter for other preprocedural examination: Secondary | ICD-10-CM | POA: Insufficient documentation

## 2015-02-23 DIAGNOSIS — I10 Essential (primary) hypertension: Secondary | ICD-10-CM | POA: Diagnosis not present

## 2015-02-23 DIAGNOSIS — Z79899 Other long term (current) drug therapy: Secondary | ICD-10-CM | POA: Diagnosis not present

## 2015-02-23 DIAGNOSIS — M65841 Other synovitis and tenosynovitis, right hand: Secondary | ICD-10-CM | POA: Insufficient documentation

## 2015-02-23 DIAGNOSIS — M1990 Unspecified osteoarthritis, unspecified site: Secondary | ICD-10-CM | POA: Diagnosis not present

## 2015-02-23 LAB — BASIC METABOLIC PANEL
Anion gap: 10 (ref 5–15)
BUN: 13 mg/dL (ref 6–20)
CALCIUM: 9.5 mg/dL (ref 8.9–10.3)
CO2: 26 mmol/L (ref 22–32)
CREATININE: 0.87 mg/dL (ref 0.44–1.00)
Chloride: 103 mmol/L (ref 101–111)
GFR calc Af Amer: >60 mL/min (ref >60)
GLUCOSE: 136 mg/dL — AB (ref 65–99)
POTASSIUM: 4 mmol/L (ref 3.5–5.1)
SODIUM: 139 mmol/L (ref 135–145)

## 2015-02-27 ENCOUNTER — Encounter (HOSPITAL_BASED_OUTPATIENT_CLINIC_OR_DEPARTMENT_OTHER)
Admission: RE | Disposition: A | Discharge status: Home / Self Care | Payer: Self-pay | Source: Ambulatory Visit | Attending: Orthopedic Surgery

## 2015-02-27 ENCOUNTER — Encounter (HOSPITAL_BASED_OUTPATIENT_CLINIC_OR_DEPARTMENT_OTHER): Payer: Self-pay | Admitting: *Deleted

## 2015-02-27 ENCOUNTER — Ambulatory Visit (HOSPITAL_BASED_OUTPATIENT_CLINIC_OR_DEPARTMENT_OTHER)
Admission: RE | Admit: 2015-02-27 | Discharge: 2015-02-27 | Disposition: A | Discharge status: Home / Self Care | Payer: 59 | Source: Ambulatory Visit | Attending: Orthopedic Surgery | Admitting: Orthopedic Surgery

## 2015-02-27 ENCOUNTER — Ambulatory Visit (HOSPITAL_BASED_OUTPATIENT_CLINIC_OR_DEPARTMENT_OTHER): Payer: 59 | Admitting: Anesthesiology

## 2015-02-27 DIAGNOSIS — I10 Essential (primary) hypertension: Secondary | ICD-10-CM | POA: Insufficient documentation

## 2015-02-27 DIAGNOSIS — Z79899 Other long term (current) drug therapy: Secondary | ICD-10-CM | POA: Insufficient documentation

## 2015-02-27 DIAGNOSIS — M1990 Unspecified osteoarthritis, unspecified site: Secondary | ICD-10-CM | POA: Insufficient documentation

## 2015-02-27 DIAGNOSIS — M65331 Trigger finger, right middle finger: Secondary | ICD-10-CM | POA: Insufficient documentation

## 2015-02-27 DIAGNOSIS — F419 Anxiety disorder, unspecified: Secondary | ICD-10-CM | POA: Insufficient documentation

## 2015-02-27 DIAGNOSIS — Z888 Allergy status to other drugs, medicaments and biological substances status: Secondary | ICD-10-CM | POA: Insufficient documentation

## 2015-02-27 DIAGNOSIS — E7800 Pure hypercholesterolemia, unspecified: Secondary | ICD-10-CM | POA: Insufficient documentation

## 2015-02-27 HISTORY — PX: TRIGGER FINGER RELEASE: SHX641

## 2015-02-27 HISTORY — DX: Unspecified osteoarthritis, unspecified site: M19.90

## 2015-02-27 SURGERY — RELEASE, A1 PULLEY, FOR TRIGGER FINGER
Anesthesia: Regional | Site: Finger | Laterality: Right

## 2015-02-27 MED ORDER — MIDAZOLAM HCL 2 MG/2ML IJ SOLN
INTRAMUSCULAR | Status: AC
  Filled 2015-02-27: qty 2

## 2015-02-27 MED ORDER — FENTANYL CITRATE (PF) 100 MCG/2ML IJ SOLN
50.0000 ug | INTRAMUSCULAR | Status: DC | PRN
  Administered 2015-02-27: 50 ug via INTRAVENOUS

## 2015-02-27 MED ORDER — ONDANSETRON HCL 4 MG/2ML IJ SOLN
INTRAMUSCULAR | Status: AC
  Filled 2015-02-27: qty 2

## 2015-02-27 MED ORDER — FENTANYL CITRATE (PF) 100 MCG/2ML IJ SOLN
INTRAMUSCULAR | Status: AC
  Filled 2015-02-27: qty 2

## 2015-02-27 MED ORDER — ONDANSETRON HCL 4 MG/2ML IJ SOLN
INTRAMUSCULAR | Status: DC | PRN
Start: 2015-02-27 — End: 2015-02-27
  Administered 2015-02-27: 4 mg via INTRAVENOUS

## 2015-02-27 MED ORDER — BUPIVACAINE HCL (PF) 0.25 % IJ SOLN
INTRAMUSCULAR | Status: DC | PRN
  Administered 2015-02-27: 5 mL

## 2015-02-27 MED ORDER — SCOPOLAMINE 1 MG/3DAYS TD PT72: 1.0000 | MEDICATED_PATCH | Freq: Once | TRANSDERMAL | Status: DC | PRN

## 2015-02-27 MED ORDER — PROPOFOL 10 MG/ML IV BOLUS
INTRAVENOUS | Status: DC | PRN
  Administered 2015-02-27: 20 mg via INTRAVENOUS
  Administered 2015-02-27: 10 mg via INTRAVENOUS

## 2015-02-27 MED ORDER — FENTANYL CITRATE (PF) 100 MCG/2ML IJ SOLN
25.0000 ug | INTRAMUSCULAR | Status: DC | PRN
  Administered 2015-02-27: 25 ug via INTRAVENOUS

## 2015-02-27 MED ORDER — PROMETHAZINE HCL 25 MG/ML IJ SOLN: 6.2500 mg | INTRAMUSCULAR | Status: DC | PRN

## 2015-02-27 MED ORDER — CHLORHEXIDINE GLUCONATE 4 % EX LIQD: 60.0000 mL | Freq: Once | CUTANEOUS | Status: DC

## 2015-02-27 MED ORDER — LIDOCAINE HCL (CARDIAC) 20 MG/ML IV SOLN
INTRAVENOUS | Status: AC
  Filled 2015-02-27: qty 5

## 2015-02-27 MED ORDER — MIDAZOLAM HCL 2 MG/2ML IJ SOLN
1.0000 mg | INTRAMUSCULAR | Status: DC | PRN
Start: 2015-02-27 — End: 2015-02-27
  Administered 2015-02-27: 1 mg via INTRAVENOUS

## 2015-02-27 MED ORDER — LACTATED RINGERS IV SOLN
INTRAVENOUS | Status: DC
  Administered 2015-02-27 (×2): via INTRAVENOUS

## 2015-02-27 MED ORDER — CEFAZOLIN SODIUM-DEXTROSE 2-3 GM-% IV SOLR
2.0000 g | INTRAVENOUS | Status: AC
  Administered 2015-02-27: 2 g via INTRAVENOUS

## 2015-02-27 MED ORDER — CEFAZOLIN SODIUM-DEXTROSE 2-3 GM-% IV SOLR
INTRAVENOUS | Status: AC
  Filled 2015-02-27: qty 50

## 2015-02-27 MED ORDER — GLYCOPYRROLATE 0.2 MG/ML IJ SOLN: 0.2000 mg | Freq: Once | INTRAMUSCULAR | Status: DC | PRN

## 2015-02-27 MED ORDER — HYDROCODONE-ACETAMINOPHEN 5-325 MG PO TABS: ORAL_TABLET | ORAL | Status: DC

## 2015-02-27 SURGICAL SUPPLY — 34 items
BANDAGE COBAN STERILE 2 (GAUZE/BANDAGES/DRESSINGS) ×3 IMPLANT
BLADE MINI RND TIP GREEN BEAV (BLADE) IMPLANT
BLADE SURG 15 STRL LF DISP TIS (BLADE) ×2 IMPLANT
BLADE SURG 15 STRL SS (BLADE) ×4
BNDG CONFORM 2 STRL LF (GAUZE/BANDAGES/DRESSINGS) ×3 IMPLANT
BNDG ESMARK 4X9 LF (GAUZE/BANDAGES/DRESSINGS) IMPLANT
CHLORAPREP W/TINT 26ML (MISCELLANEOUS) ×3 IMPLANT
CORDS BIPOLAR (ELECTRODE) ×3 IMPLANT
COVER BACK TABLE 60X90IN (DRAPES) ×3 IMPLANT
COVER MAYO STAND STRL (DRAPES) ×3 IMPLANT
CUFF TOURNIQUET SINGLE 18IN (TOURNIQUET CUFF) ×3 IMPLANT
DRAPE EXTREMITY T 121X128X90 (DRAPE) ×3 IMPLANT
DRAPE SURG 17X23 STRL (DRAPES) ×3 IMPLANT
GAUZE SPONGE 4X4 12PLY STRL (GAUZE/BANDAGES/DRESSINGS) ×3 IMPLANT
GAUZE XEROFORM 1X8 LF (GAUZE/BANDAGES/DRESSINGS) ×3 IMPLANT
GLOVE BIO SURGEON STRL SZ7.5 (GLOVE) ×3 IMPLANT
GLOVE BIOGEL PI IND STRL 7.0 (GLOVE) ×2 IMPLANT
GLOVE BIOGEL PI IND STRL 8 (GLOVE) ×1 IMPLANT
GLOVE BIOGEL PI INDICATOR 7.0 (GLOVE) ×4
GLOVE BIOGEL PI INDICATOR 8 (GLOVE) ×2
GLOVE ECLIPSE 6.5 STRL STRAW (GLOVE) ×3 IMPLANT
GOWN STRL REUS W/ TWL LRG LVL3 (GOWN DISPOSABLE) ×1 IMPLANT
GOWN STRL REUS W/TWL LRG LVL3 (GOWN DISPOSABLE) ×2
NEEDLE HYPO 25X1 1.5 SAFETY (NEEDLE) IMPLANT
NS IRRIG 1000ML POUR BTL (IV SOLUTION) ×3 IMPLANT
PACK BASIN DAY SURGERY FS (CUSTOM PROCEDURE TRAY) ×3 IMPLANT
PADDING CAST ABS 4INX4YD NS (CAST SUPPLIES) ×2
PADDING CAST ABS COTTON 4X4 ST (CAST SUPPLIES) ×1 IMPLANT
STOCKINETTE 4X48 STRL (DRAPES) ×3 IMPLANT
SUT ETHILON 4 0 PS 2 18 (SUTURE) ×3 IMPLANT
SYR BULB 3OZ (MISCELLANEOUS) ×3 IMPLANT
SYR CONTROL 10ML LL (SYRINGE) IMPLANT
TOWEL OR 17X24 6PK STRL BLUE (TOWEL DISPOSABLE) ×6 IMPLANT
UNDERPAD 30X30 (UNDERPADS AND DIAPERS) ×3 IMPLANT

## 2015-02-27 NOTE — Discharge Instructions (Addendum)

## 2015-02-27 NOTE — Anesthesia Postprocedure Evaluation (Signed)
Anesthesia Post Note  Patient: Elizabeth Li  Procedure(s) Performed: Procedure(s) (LRB): RELEASE TRIGGER FINGER/A-1 PULLEY RIGHT LONG FINGER (Right)  Patient location during evaluation: PACU Anesthesia Type: Bier Block Level of consciousness: awake and alert Pain management: pain level controlled Vital Signs Assessment: post-procedure vital signs reviewed and stable Respiratory status: spontaneous breathing and respiratory function stable Cardiovascular status: blood pressure returned to baseline and stable Postop Assessment: no signs of nausea or vomiting Anesthetic complications: no    Last Vitals:  Filed Vitals:   02/27/15 1415 02/27/15 1430  BP: 167/79 172/80  Pulse: 54 57  Temp:    Resp: 10 15    Last Pain:  Filed Vitals:   02/27/15 1438  PainSc: 4                  Rosana Farnell S

## 2015-02-27 NOTE — H&P (Signed)
  Elizabeth Li is an 59 y.o. female.   Chief Complaint: right long trigger digit HPI: 59 yo female with triggering of right long finger.  This is bothersome to her.  She wishes to have it surgically released.  Past Medical History  Diagnosis Date  . Hypertension   . Diverticulitis   . Hypercholesterolemia   . Anxiety   . Arthritis     Past Surgical History  Procedure Laterality Date  . Colonoscopy  07/15/2008    polyps-done in [place]  . Foot surgery    . Colonoscopy N/A 11/24/2014    Procedure: COLONOSCOPY;  Surgeon: Daneil Dolin, MD;  [facility];  Service: Endoscopy;  Laterality: N/A;  1430-moved to [address] to notify pt     Family History  Problem Relation Age of Onset  . Colon cancer Neg Hx    Social History:  reports that she has never smoked. She does not have any smokeless tobacco history on file. She reports that she drinks about 1.8 oz of alcohol per week. She reports that she does not use illicit drugs.  Allergies:  Allergies  Allergen Reactions  . Benadryl [Diphenhydramine Hcl]     Feels like on a "drug trip"     Medications Prior to Admission  Medication Sig Dispense Refill  . CALCIUM PO Take 1 tablet by mouth daily.    Marland Kitchen FLUoxetine (PROZAC) 20 MG capsule Take 20 mg by mouth daily.    Marland Kitchen losartan (COZAAR) 100 MG tablet Take 100 mg by mouth daily.    . Multiple Vitamin (MULTIVITAMIN WITH MINERALS) TABS tablet Take 1 tablet by mouth daily.    . Omega-3 Fatty Acids (FISH OIL PO) Take 2 capsules by mouth daily.    . simvastatin (ZOCOR) 40 MG tablet Take 40 mg by mouth daily.      No results found for this or any previous visit (from the past 48 hour(s)).  No results found.   A comprehensive review of systems was negative.  Blood pressure 148/76, pulse 64, temperature 97.7 F (36.5 C), temperature source Oral, resp. rate 16, height 5\' 2"  (1.575 m), weight 61.349 kg (135 lb 4 oz), SpO2 97 %.  General appearance: alert, cooperative  and appears stated age Head: Normocephalic, without obvious abnormality, atraumatic Neck: supple, symmetrical, trachea midline Resp: clear to auscultation bilaterally Cardio: regular rate and rhythm GI: non tender Extremities: intact sensation and capillary refill all digits.  +epl/fpl/io.  no wounds. Pulses: 2+ and symmetric Skin: Skin color, texture, turgor normal. No rashes or lesions Neurologic: Grossly normal Incision/Wound: none  Assessment/Plan Right long finger trigger digit.  Non operative and operative treatment options were discussed with the patient and patient wishes to proceed with operative treatment. Risks, benefits, and alternatives of surgery were discussed and the patient agrees with the plan of care.   Werner Labella R 02/27/2015, 1:36 PM

## 2015-02-27 NOTE — Transfer of Care (Signed)
Immediate Anesthesia Transfer of Care Note  Patient: Elizabeth Li  Procedure(s) Performed: Procedure(s): RELEASE TRIGGER FINGER/A-1 PULLEY RIGHT LONG FINGER (Right)  Patient Location: PACU  Anesthesia Type:Bier block  Level of Consciousness: awake, alert , oriented and patient cooperative  Airway & Oxygen Therapy: Patient Spontanous Breathing and Patient connected to face mask oxygen  Post-op Assessment: Report given to RN and Post -op Vital signs reviewed and stable  Post vital signs: Reviewed and stable  Last Vitals:  Filed Vitals:   02/27/15 1238  BP: 148/76  Pulse: 64  Temp: 36.5 C  Resp: 16    Complications: No apparent anesthesia complications

## 2015-02-27 NOTE — Brief Op Note (Signed)
02/27/2015  2:03 PM  PATIENT:  Elizabeth Li  59 y.o. female  PRE-OPERATIVE DIAGNOSIS:  STENOSING TENOSYNOVITIS RIGHT LONG FINGER  POST-OPERATIVE DIAGNOSIS:  STENOSING TENOSYNOVITIS RIGHT LONG FINGER  PROCEDURE:  Procedure(s): RELEASE TRIGGER FINGER/A-1 PULLEY RIGHT LONG FINGER (Right)  SURGEON:  Surgeon(s) and Role:    * Leanora Cover, MD - Primary  PHYSICIAN ASSISTANT:   ASSISTANTS: none   ANESTHESIA:   Bier block with sedation  EBL:  Total I/O In: 1000 [I.V.:1000] Out: -   BLOOD ADMINISTERED:none  DRAINS: none   LOCAL MEDICATIONS USED:  MARCAINE     SPECIMEN:  No Specimen  DISPOSITION OF SPECIMEN:  N/A  COUNTS:  YES  TOURNIQUET:   Total Tourniquet Time Documented: Forearm (Right) - 15 minutes Total: Forearm (Right) - 15 minutes   DICTATION: .Other Dictation: Dictation Number [id number]  PLAN OF CARE: Discharge to home after PACU  PATIENT DISPOSITION:  PACU - hemodynamically stable.

## 2015-02-27 NOTE — Op Note (Signed)
[id number] 

## 2015-02-27 NOTE — Anesthesia Procedure Notes (Addendum)
Procedure Name: MAC Date/Time: 02/27/2015 1:40 PM Performed by: Latasha Puskas D Pre-anesthesia Checklist: Emergency Drugs available, Suction available, Patient being monitored, Timeout performed and Patient identified Patient Re-evaluated:Patient Re-evaluated prior to inductionOxygen Delivery Method: Simple face mask   Anesthesia Regional Block:  Bier block (IV Regional)  Pre-Anesthetic Checklist: ,, timeout performed, Correct Patient, Correct Site, Correct Laterality, Correct Procedure,, site marked, surgical consent,, at surgeon's request Needles:  Injection technique: Single-shot  Needle Type: Other      Needle Gauge: 20 and 20 G    Additional Needles: Bier block (IV Regional) Narrative:   Performed by: Personally

## 2015-02-27 NOTE — Anesthesia Preprocedure Evaluation (Signed)
Anesthesia Evaluation  Patient identified by MRN, date of birth, ID band Patient awake    Reviewed: Allergy & Precautions, NPO status , Patient's Chart, lab work & pertinent test results  Airway Mallampati: II  TM Distance: >3 FB Neck ROM: Full    Dental no notable dental hx.    Pulmonary neg pulmonary ROS,    Pulmonary exam normal breath sounds clear to auscultation       Cardiovascular hypertension, Pt. on medications Normal cardiovascular exam Rhythm:Regular Rate:Normal     Neuro/Psych negative neurological ROS  negative psych ROS   GI/Hepatic negative GI ROS, Neg liver ROS,   Endo/Other  negative endocrine ROS  Renal/GU negative Renal ROS  negative genitourinary   Musculoskeletal negative musculoskeletal ROS (+)   Abdominal   Peds negative pediatric ROS (+)  Hematology negative hematology ROS (+)   Anesthesia Other Findings   Reproductive/Obstetrics negative OB ROS                             Anesthesia Physical Anesthesia Plan  ASA: II  Anesthesia Plan: Bier Block   Post-op Pain Management:    Induction: Intravenous  Airway Management Planned: Simple Face Mask  Additional Equipment:   Intra-op Plan:   Post-operative Plan:   Informed Consent: I have reviewed the patients History and Physical, chart, labs and discussed the procedure including the risks, benefits and alternatives for the proposed anesthesia with the patient or authorized representative who has indicated his/her understanding and acceptance.   Dental advisory given  Plan Discussed with: CRNA and Surgeon  Anesthesia Plan Comments:         Anesthesia Quick Evaluation

## 2015-02-28 ENCOUNTER — Encounter (HOSPITAL_BASED_OUTPATIENT_CLINIC_OR_DEPARTMENT_OTHER): Payer: Self-pay | Admitting: Orthopedic Surgery

## 2015-02-28 NOTE — Op Note (Signed)
NAMEMYISHA, Elizabeth Li             ACCOUNT NO.:  [id number]  MEDICAL RECORD NO.:  [id number]  LOCATION:                                 FACILITY:  PHYSICIAN:  Leanora Cover, MD        DATE OF BIRTH:  [birth year]  DATE OF PROCEDURE:  02/27/2015 DATE OF DISCHARGE:                              OPERATIVE REPORT   PREOPERATIVE DIAGNOSIS:  Right long finger trigger digit.  POSTOPERATIVE DIAGNOSIS:  Right long finger trigger digit.  PROCEDURE:  Right long finger trigger release.  SURGEON:  Leanora Cover, MD  ASSISTANT:  None.  ANESTHESIA:  Bier block with sedation.  IV FLUIDS:  Per Anesthesia flow sheet.  ESTIMATED FLUID LOSS:  Minimal.  COMPLICATIONS:  None.  SPECIMENS:  None.  TOURNIQUET TIME:  15 minutes.  DISPOSITION:  Stable to PACU.  INDICATIONS:  Elizabeth Li is a 59 year old female with triggering of the right long finger.  It is bothersome to her.  She wished to have it surgically released.  The risks, benefits, and alternatives of surgery were discussed including the risk of blood loss; infection; damage to nerves, vessels, tendons, ligaments, bone; failure of surgery; need for additional surgery; complications with wound healing, continued pain, recurrence of triggering.  She voiced understanding of these risks and elected to proceed.  OPERATIVE COURSE:  After being identified preoperatively by myself, the patient and I agreed upon procedure and site procedure.  Surgical site was marked.  The risks, benefits, and alternatives of surgery were reviewed and she wished to proceed.  Surgical consent had been signed. She was given IV Ancef as preoperative antibiotic prophylaxis.  She was transferred to the operating room and placed on the operating table in supine position with the right upper extremity on arm board.  Bier block anesthesia was induced by Anesthesiology.  Right upper extremity was prepped and draped in normal sterile orthopedic fashion.  Surgical  pause was performed between surgeons, anesthesia, operating room staff, and all were in agreement as to patient, procedure, and site of procedure. Tourniquet had been inflated for the Bier block.  Incision was made over the MP joint of the long finger and carried into subcutaneous tissues by spreading technique.  Bipolar electrocautery used to obtain hemostasis. A1 pulley was identified and sharply incised.  The proximal aspect of the A2 pulley was vented to allow better excursion of the tendons. There was a small A0 pulley which was released as well.  The tendons were brought through the wound and separated.  There was some adhesion between them.  Finger was placed through a range of motion.  There was no clicking.  The wound was copiously irrigated with sterile saline and closed with 4-0 nylon horizontal mattress fashion.  It was then injected with 5 mL of 0.25% plain Marcaine to aid in postoperative analgesia.  It was then dressed with sterile Xeroform, 4x4s, and wrapped with a Coban dressing lightly.  Tourniquet was deflated at 15 minutes.  Fingertips were pink with brisk capillary refill.  After deflation of tourniquet, operative drapes were broken down.  The patient was awoken from anesthesia safely.  She was transferred back to stretcher and taken to the PACU  in stable condition.  I will see her back in the office in 1 week for postoperative followup.  I will give her Norco 5/325, 1-2 p.o. q.6 hours p.r.n. pain, dispensed #30.     Leanora Cover, MD     KK/MEDQ  D:  02/27/2015  T:  02/28/2015  Job:  [id number]

## 2015-03-06 DIAGNOSIS — M65331 Trigger finger, right middle finger: Secondary | ICD-10-CM | POA: Insufficient documentation

## 2015-03-19 MED FILL — SIMVASTATIN 80 MG TABLET: 80 | 90 days supply | Qty: 45 | Fill #3

## 2015-03-19 MED FILL — FLUoxetine HCL 20 MG CAPS: 20 | 90 days supply | Qty: 90 | Fill #2

## 2015-03-21 MED FILL — LOSARTAN POTASSIUM 100 MG T: 100 | 90 days supply | Qty: 90 | Fill #0

## 2015-06-08 DIAGNOSIS — J060 Acute laryngopharyngitis: Secondary | ICD-10-CM | POA: Diagnosis not present

## 2015-06-08 DIAGNOSIS — R05 Cough: Secondary | ICD-10-CM | POA: Diagnosis not present

## 2015-06-14 MED FILL — LOSARTAN POTASSIUM 100 MG T: 100 | 90 days supply | Qty: 90 | Fill #1

## 2015-06-14 MED FILL — FLUoxetine HCL 20 MG CAPS: 20 | 90 days supply | Qty: 90 | Fill #3

## 2015-06-18 MED FILL — SIMVASTATIN 80 MG TABLET: 80 | 90 days supply | Qty: 45 | Fill #0

## 2015-07-03 DIAGNOSIS — I10 Essential (primary) hypertension: Secondary | ICD-10-CM | POA: Diagnosis not present

## 2015-07-05 DIAGNOSIS — I10 Essential (primary) hypertension: Secondary | ICD-10-CM | POA: Diagnosis not present

## 2015-07-05 DIAGNOSIS — E782 Mixed hyperlipidemia: Secondary | ICD-10-CM | POA: Diagnosis not present

## 2015-07-05 DIAGNOSIS — F339 Major depressive disorder, recurrent, unspecified: Secondary | ICD-10-CM | POA: Diagnosis not present

## 2015-07-05 DIAGNOSIS — F411 Generalized anxiety disorder: Secondary | ICD-10-CM | POA: Diagnosis not present

## 2015-07-09 ENCOUNTER — Telehealth: Payer: Self-pay

## 2015-07-09 ENCOUNTER — Telehealth: Payer: Self-pay | Admitting: Internal Medicine

## 2015-07-09 ENCOUNTER — Ambulatory Visit (HOSPITAL_COMMUNITY)
Admission: RE | Admit: 2015-07-09 | Discharge: 2015-07-09 | Disposition: A | Discharge status: Home / Self Care | Payer: 59 | Source: Ambulatory Visit | Attending: Gastroenterology | Admitting: Gastroenterology

## 2015-07-09 ENCOUNTER — Other Ambulatory Visit: Payer: Self-pay

## 2015-07-09 DIAGNOSIS — K760 Fatty (change of) liver, not elsewhere classified: Secondary | ICD-10-CM | POA: Insufficient documentation

## 2015-07-09 DIAGNOSIS — K5732 Diverticulitis of large intestine without perforation or abscess without bleeding: Secondary | ICD-10-CM | POA: Diagnosis not present

## 2015-07-09 DIAGNOSIS — R1032 Left lower quadrant pain: Secondary | ICD-10-CM | POA: Diagnosis not present

## 2015-07-09 MED ORDER — AMOXICILLIN-POT CLAVULANATE 875-125 MG PO TABS: 1.0000 | ORAL_TABLET | Freq: Two times a day (BID) | ORAL | Status: DC

## 2015-07-09 MED ORDER — IOPAMIDOL (ISOVUE-300) INJECTION 61%
100.0000 mL | Freq: Once | INTRAVENOUS | Status: AC | PRN
  Administered 2015-07-09: 100 mL via INTRAVENOUS

## 2015-07-09 MED FILL — AMOX-CLAV 875-125 MG TABLET: 875-125 | 10 days supply | Qty: 20 | Fill #0

## 2015-07-09 NOTE — Telephone Encounter (Signed)
Pt left a voicemail- she has a history of diverticulitis. She said the symptoms started Saturday and are the same as they were before. She said she is getting worse this morning. Pt works at [employer] and will be at work until [employer] and will not be able to come to an appt on Wednesday.

## 2015-07-09 NOTE — Telephone Encounter (Signed)
Yes, may order a stat CT for today. We will try to treat as outpatient, but she will need routine follow-up in next 1-2 weeks, non-urgent, following treatment IF this is diverticulitis.

## 2015-07-09 NOTE — Telephone Encounter (Signed)
Pt said she talked to her director and radiology and they are fine with her doing a CT today. Ginger please schedule STAT CT at [facility]. Pt said she needed it done before 5pm because the other secretary leaves at 5 and she wont have anyone to cover for her.

## 2015-07-09 NOTE — Telephone Encounter (Signed)
Elizabeth Li, please see AS note. Pt will need a non-urgent follow up in 1-2 weeks. Pt is aware that she needs this appt.

## 2015-07-09 NOTE — Telephone Encounter (Signed)
pts number at work is [phone]

## 2015-07-09 NOTE — Telephone Encounter (Signed)
CT shows uncomplicated focal colitis or diverticulitis. We will treat as diverticulitis. As she did not like the Flagyl, I can't just give her Cipro and that be it. It would not cover her effectively.  So, I've done an alternative antibiotic regimen for her. I sent in Augmentin to take twice a day for 10 days.   She is to follow a low fiber diet for the next few days until symptoms improve. Drink plenty of water. If worsening symptoms, call us.   As this is her second episode, may want to consider in the future seeing a surgeon electively for colectomy. This is not urgent and she can think about it.

## 2015-07-09 NOTE — Telephone Encounter (Signed)
I called the pt, she said she is going to check and see if she can have CT done at [facility] today and will call back and let us know.

## 2015-07-09 NOTE — Telephone Encounter (Signed)
Pt said she is having a diverticulitis attack and needed to be put on medicine. She is wanting to be seen tomorrow. I told her that I have 2 openings on Wednesday and that was all that was available. 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 I told her again that nothing was available tomorrow unless someone cancels. I transferred call to JL VM.

## 2015-07-09 NOTE — Telephone Encounter (Signed)
Pt called and said that if she needs medication that Flagyl was to strong for her last time but the Cipro was good. She goes to the [facility] the number there is [phone]

## 2015-07-09 NOTE — Telephone Encounter (Signed)
Pt is aware. Pt wanted abx to go to [facility]. I called the rx in to them.

## 2015-07-09 NOTE — Telephone Encounter (Signed)
Pt is aware of appointment today at 1:00.

## 2015-07-10 NOTE — Telephone Encounter (Signed)
APPT MADE AND PATIENT CALLED AND IS AWARE OF DATE AND TIME OF APPOINTMENT

## 2015-07-17 ENCOUNTER — Encounter: Payer: Self-pay | Admitting: Gastroenterology

## 2015-07-17 ENCOUNTER — Ambulatory Visit (INDEPENDENT_AMBULATORY_CARE_PROVIDER_SITE_OTHER): Payer: 59 | Admitting: Gastroenterology

## 2015-07-17 VITALS — BP 151/78 | HR 67 | Temp 97.6°F | Ht 62.0 in | Wt 130.2 lb

## 2015-07-17 DIAGNOSIS — K5732 Diverticulitis of large intestine without perforation or abscess without bleeding: Secondary | ICD-10-CM

## 2015-07-17 MED ORDER — LINACLOTIDE 72 MCG PO CAPS: 72.0000 ug | ORAL_CAPSULE | Freq: Every day | ORAL | Status: DC

## 2015-07-17 MED ORDER — PLECANATIDE 3 MG PO TABS: 1.0000 | ORAL_TABLET | Freq: Every day | ORAL | Status: DC

## 2015-07-17 MED FILL — LINZESS 72 MCG CAPSULE: 72 | 90 days supply | Qty: 90 | Fill #0

## 2015-07-17 NOTE — Patient Instructions (Addendum)
Today: you may take Miralax 1 capful with a full 8 ounces of liquid and repeat up to 3 times if needed.   The medication we talked about (Trulance) is new and may not be covered by insurance yet. I know Linzess will be 30$ for a 90 day prescription, so I have sent this to [facility]. This is the lowest dose they have, and I think it will do well for you. Take 1 capsule each morning on an empty stomach at least 30 minutes before breakfast.

## 2015-07-17 NOTE — Assessment & Plan Note (Signed)
Second episode, improved with antibiotic therapy. Instead of Cipro/Flagyl dosing, she requested a different agent than Flagyl. She is doing well on the course of Augmentin. Clinically, she has improved. Discussed dietary measures. With history of constipation, may take Miralax today. I have provided a prescription for Linzess 72 mcg once each morning to trial in the future. Agree with seeing Dr. Excell Seltzer, as this is her second episode of CT documented diverticulitis. She will arrange this appointment and has spoken to him unofficially recently Control and instrumentation engineer at [employer]). If she needs a referral, I will be happy to get arrange this. Next surveillance colonoscopy in 2021.

## 2015-07-17 NOTE — Progress Notes (Signed)
Referring Provider: Celene Squibb, MD Primary Care Physician:  Wende Neighbors, MD  Primary GI: Dr. Gala Romney   Chief Complaint  Patient presents with  . Follow-up    HPI:   Elizabeth Li is a 60 y.o. female presenting today with a history of diverticulitis in March 2015 and most recently April 0000000 with uncomplicated diverticulitis, treated with Augmentin X 10 days as she did not do well with Flagyl historically. Colonoscopy is on file from Sept 2016 with diverticulosis. Due to history of polyps, surveillance due in 2021. She returns for follow-up after diverticulitis.   Feels improved. Constipated now. Still with small spasms in LLQ but much improved. Took dulcolax and was not very productive. Makes her own concoction in evenings of prunes, raisin, senna powder. Will be seeing Dr. Excell Seltzer in the future to discuss elective colectomy. Interested in trying prescriptive agent for constipation.   Past Medical History  Diagnosis Date  . Hypertension   . Diverticulitis   . Hypercholesterolemia   . Anxiety   . Arthritis     Past Surgical History  Procedure Laterality Date  . Colonoscopy  07/15/2008    polyps-done in [place]  . Foot surgery    . Colonoscopy N/A 11/24/2014    Dr. Gala Romney: colonic diverticulosis, melanosis coli, surveillance in 2021  . Trigger finger release Right 02/27/2015    Procedure: RELEASE TRIGGER FINGER/A-1 PULLEY RIGHT LONG FINGER;  Surgeon: Leanora Cover, MD;  Location: [facility];  Service: Orthopedics;  Laterality: Right;    Current Outpatient Prescriptions  Medication Sig Dispense Refill  . amoxicillin-clavulanate (AUGMENTIN) 875-125 MG tablet Take 1 tablet by mouth 2 (two) times daily. For 10 days 20 tablet 0  . CALCIUM PO Take 1 tablet by mouth daily.    Marland Kitchen FLUoxetine (PROZAC) 20 MG capsule Take 20 mg by mouth daily.    Marland Kitchen losartan (COZAAR) 100 MG tablet Take 100 mg by mouth daily.    . Multiple Vitamin (MULTIVITAMIN WITH MINERALS) TABS tablet  Take 1 tablet by mouth daily.    . Omega-3 Fatty Acids (FISH OIL PO) Take 2 capsules by mouth daily.    . simvastatin (ZOCOR) 40 MG tablet Take 40 mg by mouth daily.    Marland Kitchen HYDROcodone-acetaminophen (NORCO) 5-325 MG tablet 1-2 tabs po q6 hours prn pain (Patient not taking: Reported on 07/17/2015) 30 tablet 0   No current facility-administered medications for this visit.    Allergies as of 07/17/2015 - Review Complete 07/17/2015  Allergen Reaction Noted  . Benadryl [diphenhydramine hcl]  11/09/2014    Family History  Problem Relation Age of Onset  . Colon cancer Neg Hx     Social History   Social History  . Marital Status: Married    Spouse Name: N/A  . Number of Children: N/A  . Years of Education: N/A   Occupational History  . [employer]   Social History Main Topics  . Smoking status: Never Smoker   . Smokeless tobacco: None  . Alcohol Use: 1.8 oz/week    3 Glasses of wine, 0 Standard drinks or equivalent per week  . Drug Use: No  . Sexual Activity: Not Asked   Other Topics Concern  . None   Social History Narrative    Review of Systems: As mentioned in HPI  Physical Exam: BP 151/78 mmHg  Pulse 67  Temp(Src) 97.6 F (36.4 C) (Oral)  Ht 5\' 2"  (1.575 m)  Wt  130 lb 3.2 oz (59.058 kg)  BMI 23.81 kg/m2 General:   Alert and oriented. No distress noted. Pleasant and cooperative.  Head:  Normocephalic and atraumatic. Eyes:  Conjuctiva clear without scleral icterus. Mouth:  Oral mucosa pink and moist. Good dentition. No lesions. Neck:  Supple, without mass or thyromegaly. Heart:  S1, S2 present without murmurs, rubs, or gallops. Regular rate and rhythm. Abdomen:  +BS, soft, non-tender and non-distended. No rebound or guarding. No HSM or masses noted. Msk:  Symmetrical without gross deformities. Normal posture. Pulses:  2+ DP noted bilaterally Extremities:  Without edema. Neurologic:  Alert and  oriented x4;  grossly normal  neurologically. Skin:  Intact without significant lesions or rashes. Cervical Nodes:  No significant cervical adenopathy. Psych:  Alert and cooperative. Normal mood and affect.

## 2015-07-18 NOTE — Progress Notes (Signed)
CC'ED TO PCP 

## 2015-07-26 ENCOUNTER — Ambulatory Visit: Payer: 59 | Admitting: Gastroenterology

## 2015-08-02 ENCOUNTER — Ambulatory Visit: Payer: 59 | Admitting: Gastroenterology

## 2015-08-17 ENCOUNTER — Ambulatory Visit (INDEPENDENT_AMBULATORY_CARE_PROVIDER_SITE_OTHER): Payer: 59 | Admitting: Gastroenterology

## 2015-08-17 ENCOUNTER — Encounter: Payer: Self-pay | Admitting: Gastroenterology

## 2015-08-17 ENCOUNTER — Other Ambulatory Visit: Payer: Self-pay | Admitting: General Surgery

## 2015-08-17 VITALS — BP 140/81 | HR 77 | Temp 97.8°F | Ht 63.0 in | Wt 129.6 lb

## 2015-08-17 DIAGNOSIS — K5732 Diverticulitis of large intestine without perforation or abscess without bleeding: Secondary | ICD-10-CM

## 2015-08-17 MED ORDER — AMOXICILLIN-POT CLAVULANATE 875-125 MG PO TABS: 1.0000 | ORAL_TABLET | Freq: Two times a day (BID) | ORAL | Status: DC

## 2015-08-17 MED ORDER — HYDROCODONE-ACETAMINOPHEN 5-325 MG PO TABS: 1.0000 | ORAL_TABLET | Freq: Four times a day (QID) | ORAL | Status: DC | PRN

## 2015-08-17 MED FILL — NEOMYCIN 500 MG TABLET: 500 | 1 days supply | Qty: 6 | Fill #0

## 2015-08-17 MED FILL — ERYTHROMYCIN 500 MG FILMTAB: 500 | 1 days supply | Qty: 6 | Fill #0

## 2015-08-17 MED FILL — HYDROCODON-APAP 5-325: 5-325 | 7 days supply | Qty: 30 | Fill #0

## 2015-08-17 MED FILL — AMOX-CLAV 875-125 MG TABLET: 875-125 | 10 days supply | Qty: 20 | Fill #0

## 2015-08-17 NOTE — Progress Notes (Signed)
Referring Provider: Celene Squibb, MD Primary Care Physician:  Wende Neighbors, MD  Primary GI: Dr. Gala Romney   Chief Complaint  Patient presents with  . Follow-up    HPI:   Elizabeth Li is a 60 y.o. female presenting today with a history of diverticulitis in March 2015 and most recently April 0000000 with uncomplicated diverticulitis, treated with Augmentin X 10 days as she did not do well with Flagyl historically. Colonoscopy is on file from Sept 2016 with diverticulosis. Due to history of polyps, surveillance due in 2021. Last seen a month ago, with plans to see Dr. Excell Seltzer for consideration of elective colectomy. Prescribed Linzess 72 mcg for constipation at last visit.   Was increasing fiber in her diet, taking Linzess, then LLQ pain restarted. Feels like she is going downhill. Has appt with Dr. Excell Seltzer next Thursday. Now with diarrhea with Linzess. Felt like "crap" all day yesterday. Up all night with pain a few nights ago. LLQ discomfort, similar to prior presentations. No fever or chills, no N/V.   Past Medical History  Diagnosis Date  . Hypertension   . Diverticulitis   . Hypercholesterolemia   . Anxiety   . Arthritis     Past Surgical History  Procedure Laterality Date  . Colonoscopy  07/15/2008    polyps-done in [place]  . Foot surgery    . Colonoscopy N/A 11/24/2014    Dr. Gala Romney: colonic diverticulosis, melanosis coli, surveillance in 2021  . Trigger finger release Right 02/27/2015    Procedure: RELEASE TRIGGER FINGER/A-1 PULLEY RIGHT LONG FINGER;  Surgeon: Leanora Cover, MD;  Location: [facility];  Service: Orthopedics;  Laterality: Right;    Current Outpatient Prescriptions  Medication Sig Dispense Refill  . CALCIUM PO Take 1 tablet by mouth daily.    Marland Kitchen FLUoxetine (PROZAC) 20 MG capsule Take 20 mg by mouth daily.    Marland Kitchen linaclotide (LINZESS) 72 MCG capsule Take 1 capsule (72 mcg total) by mouth daily before breakfast. 90 capsule 3  . losartan (COZAAR)  100 MG tablet Take 100 mg by mouth daily.    . Multiple Vitamin (MULTIVITAMIN WITH MINERALS) TABS tablet Take 1 tablet by mouth daily.    . Omega-3 Fatty Acids (FISH OIL PO) Take 2 capsules by mouth daily.    . simvastatin (ZOCOR) 40 MG tablet Take 40 mg by mouth daily.    Marland Kitchen amoxicillin-clavulanate (AUGMENTIN) 875-125 MG tablet Take 1 tablet by mouth 2 (two) times daily. For 10 days (Patient not taking: Reported on 08/17/2015) 20 tablet 0   No current facility-administered medications for this visit.    Allergies as of 08/17/2015 - Review Complete 08/17/2015  Allergen Reaction Noted  . Benadryl [diphenhydramine hcl]  11/09/2014    Family History  Problem Relation Age of Onset  . Colon cancer Neg Hx     Social History   Social History  . Marital Status: Married    Spouse Name: N/A  . Number of Children: N/A  . Years of Education: N/A   Occupational History  . [employer]   Social History Main Topics  . Smoking status: Never Smoker   . Smokeless tobacco: None  . Alcohol Use: 1.8 oz/week    3 Glasses of wine, 0 Standard drinks or equivalent per week  . Drug Use: No  . Sexual Activity: Not Asked   Other Topics Concern  . None   Social History Narrative    Review  of Systems: As mentioned in HPI.   Physical Exam: BP 140/81 mmHg  Pulse 77  Temp(Src) 97.8 F (36.6 C) (Oral)  Ht 5\' 3"  (1.6 m)  Wt 129 lb 9.6 oz (58.786 kg)  BMI 22.96 kg/m2 General:   Alert and oriented. No distress noted. Pleasant and cooperative.  Head:  Normocephalic and atraumatic. Eyes:  Conjuctiva clear without scleral icterus. Abdomen:  +BS, soft, moderately TTP LLQ, appears bloated but no rebound or guarding. Remains soft. No peritoneal signs.  Msk:  Symmetrical without gross deformities. Normal posture. Extremities:  Without edema. Neurologic:  Alert and  oriented x4;  grossly normal neurologically. Psych:  Alert and cooperative. Normal mood and affect.

## 2015-08-17 NOTE — Assessment & Plan Note (Signed)
History of diverticulitis in 2015, April 2017 CT documented, now clinically with recurrent diverticulitis. From physical exam, history, and presentation, appears uncomplicated. I would like to avoid a repeat CT UNLESS she has worsening of symptoms. Will treat empirically with Augmentin X 10 days. I have provided norco for supportive measures. Her colonoscopy is up-to-date. I have asked her to call if anything worsens, and we would order a stat CT to be done to avoid any emergency room presentation. She is appropriate for outpatient treatment of this. Again, I discussed signs and symptoms extensively with her that would necessitate urgent evaluation. Agree with seeing Dr. Excell Seltzer next week, as I anticipate she will need an elective colectomy at some point in the near future.

## 2015-08-17 NOTE — Patient Instructions (Addendum)
I have given you Augmentin to take twice a day for 10 days.   I have also printed a prescription for pain. Do not take while driving, working.   Please call our office or seek medical attention if any of your symptoms worsen, you develop a fever greater than 101, chills, vomiting, worsening abdominal distension.    I am glad you have an appointment upcoming with the surgeon. I have attached a diet handout to follow until your symptoms start improving.    Diverticulitis Diverticulitis is inflammation or infection of small pouches in your colon that form when you have a condition called diverticulosis. The pouches in your colon are called diverticula. Your colon, or large intestine, is where water is absorbed and stool is formed. Complications of diverticulitis can include:  Bleeding.  Severe infection.  Severe pain.  Perforation of your colon.  Obstruction of your colon. CAUSES  Diverticulitis is caused by bacteria. Diverticulitis happens when stool becomes trapped in diverticula. This allows bacteria to grow in the diverticula, which can lead to inflammation and infection. RISK FACTORS People with diverticulosis are at risk for diverticulitis. Eating a diet that does not include enough fiber from fruits and vegetables may make diverticulitis more likely to develop. SYMPTOMS  Symptoms of diverticulitis may include:  Abdominal pain and tenderness. The pain is normally located on the left side of the abdomen, but may occur in other areas.  Fever and chills.  Bloating.  Cramping.  Nausea.  Vomiting.  Constipation.  Diarrhea.  Blood in your stool. DIAGNOSIS  Your health care provider will ask you about your medical history and do a physical exam. You may need to have tests done because many medical conditions can cause the same symptoms as diverticulitis. Tests may include:  Blood tests.  Urine tests.  Imaging tests of the abdomen, including X-rays and CT  scans. When your condition is under control, your health care provider may recommend that you have a colonoscopy. A colonoscopy can show how severe your diverticula are and whether something else is causing your symptoms. TREATMENT  Most cases of diverticulitis are mild and can be treated at home. Treatment may include:  Taking over-the-counter pain medicines.  Following a clear liquid diet.  Taking antibiotic medicines by mouth for 7-10 days. More severe cases may be treated at a hospital. Treatment may include:  Not eating or drinking.  Taking prescription pain medicine.  Receiving antibiotic medicines through an IV tube.  Receiving fluids and nutrition through an IV tube.  Surgery. HOME CARE INSTRUCTIONS   Follow your health care provider's instructions carefully.  Follow a full liquid diet or other diet as directed by your health care provider. After your symptoms improve, your health care provider may tell you to change your diet. He or she may recommend you eat a high-fiber diet. Fruits and vegetables are good sources of fiber. Fiber makes it easier to pass stool.  Take fiber supplements or probiotics as directed by your health care provider.  Only take medicines as directed by your health care provider.  Keep all your follow-up appointments. SEEK MEDICAL CARE IF:   Your pain does not improve.  You have a hard time eating food.  Your bowel movements do not return to normal. SEEK IMMEDIATE MEDICAL CARE IF:   Your pain becomes worse.  Your symptoms do not get better.  Your symptoms suddenly get worse.  You have a fever.  You have repeated vomiting.  You have bloody or black, tarry  stools. MAKE SURE YOU:   Understand these instructions.  Will watch your condition.  Will get help right away if you are not doing well or get worse.   This information is not intended to replace advice given to you by your health care provider. Make sure you discuss any  questions you have with your health care provider.   Document Released: 12/11/2004 Document Revised: 03/08/2013 Document Reviewed: 01/26/2013 Elsevier Interactive Patient Education 2016 Elsevier Inc. Low-Fiber Diet Fiber is found in fruits, vegetables, and whole grains. A low-fiber diet restricts fibrous foods that are not digested in the small intestine. A diet containing about 10-15 grams of fiber per day is considered low fiber. Low-fiber diets may be used to:  Promote healing and rest the bowel during intestinal flare-ups.  Prevent blockage of a partially obstructed or narrowed gastrointestinal tract.  Reduce fecal weight and volume.  Slow the movement of feces. You may be on a low-fiber diet as a transitional diet following surgery, after an injury (trauma), or because of a short (acute) or lifelong (chronic) illness. Your health care provider will determine the length of time you need to stay on this diet.  WHAT DO I NEED TO KNOW ABOUT A LOW-FIBER DIET? Always check the fiber content on the packaging's Nutrition Facts label, especially on foods from the grains list. Ask your dietitian if you have questions about specific foods that are related to your condition, especially if the food is not listed below. In general, a low-fiber food will have less than 2 g of fiber. WHAT FOODS CAN I EAT? Grains All breads and crackers made with white flour. Sweet rolls, doughnuts, waffles, pancakes, [place] toast, bagels. Pretzels, Melba toast, zwieback. Well-cooked cereals, such as cornmeal, farina, or cream cereals. Dry cereals that do not contain whole grains, fruit, or nuts, such as refined corn, wheat, rice, and oat cereals. Potatoes prepared any way without skins, plain pastas and noodles, refined white rice. Use white flour for baking and making sauces. Use allowed list of grains for casseroles, dumplings, and puddings.  Vegetables Strained tomato and vegetable juices. Fresh lettuce, cucumber,  spinach. Well-cooked (no skin or pulp) or canned vegetables, such as asparagus, bean sprouts, beets, carrots, green beans, mushrooms, potatoes, pumpkin, spinach, yellow squash, tomato sauce/puree, turnips, yams, and zucchini. Keep servings limited to  cup.  Fruits All fruit juices except prune juice. Cooked or canned fruits without skin and seeds, such as applesauce, apricots, cherries, fruit cocktail, grapefruit, grapes, mandarin oranges, melons, peaches, pears, pineapple, and plums. Fresh fruits without skin, such as apricots, avocados, bananas, melons, pineapple, nectarines, and peaches. Keep servings limited to  cup or 1 piece.  Meat and Other Protein Sources Ground or well-cooked tender beef, ham, veal, lamb, pork, or poultry. Eggs, plain cheese. Fish, oysters, shrimp, lobster, and other seafood. Liver, organ meats. Smooth nut butters. Dairy All milk products and alternative dairy substitutes, such as soy, rice, almond, and coconut, not containing added whole nuts, seeds, or added fruit. Beverages Decaf coffee, fruit, and vegetable juices or smoothies (small amounts, with no pulp or skins, and with fruits from allowed list), sports drinks, herbal tea. Condiments Ketchup, mustard, vinegar, cream sauce, cheese sauce, cocoa powder. Spices in moderation, such as allspice, basil, bay leaves, celery powder or leaves, cinnamon, cumin powder, curry powder, ginger, mace, marjoram, onion or garlic powder, oregano, paprika, parsley flakes, ground pepper, rosemary, sage, savory, tarragon, thyme, and turmeric. Sweets and Desserts Plain cakes and cookies, pie made with allowed fruit, pudding,  custard, cream pie. Gelatin, fruit, ice, sherbet, frozen ice pops. Ice cream, ice milk without nuts. Plain hard candy, honey, jelly, molasses, syrup, sugar, chocolate syrup, gumdrops, marshmallows. Limit overall sugar intake.  Fats and Oil Margarine, butter, cream, mayonnaise, salad oils, plain salad dressings made from  allowed foods. Choose healthy fats such as olive oil, canola oil, and omega-3 fatty acids (such as found in salmon or tuna) when possible.  Other Bouillon, broth, or cream soups made from allowed foods. Any strained soup. Casseroles or mixed dishes made with allowed foods. The items listed above may not be a complete list of recommended foods or beverages. Contact your dietitian for more options.  WHAT FOODS ARE NOT RECOMMENDED? Grains All whole wheat and whole grain breads and crackers. Multigrains, rye, bran seeds, nuts, or coconut. Cereals containing whole grains, multigrains, bran, coconut, nuts, raisins. Cooked or dry oatmeal, steel-cut oats. Coarse wheat cereals, granola. Cereals advertised as high fiber. Potato skins. Whole grain pasta, wild or brown rice. Popcorn. Coconut flour. Bran, buckwheat, corn bread, multigrains, rye, wheat germ.  Vegetables Fresh, cooked or canned vegetables, such as artichokes, asparagus, beet greens, broccoli, Brussels sprouts, cabbage, celery, cauliflower, corn, eggplant, kale, legumes or beans, okra, peas, and tomatoes. Avoid large servings of any vegetables, especially raw vegetables.  Fruits Fresh fruits, such as apples with or without skin, berries, cherries, figs, grapes, grapefruit, guavas, kiwis, mangoes, oranges, papayas, pears, persimmons, pineapple, and pomegranate. Prune juice and juices with pulp, stewed or dried prunes. Dried fruits, dates, raisins. Fruit seeds or skins. Avoid large servings of all fresh fruits. Meats and Other Protein Sources Tough, fibrous meats with gristle. Chunky nut butter. Cheese made with seeds, nuts, or other foods not recommended. Nuts, seeds, legumes (beans, including baked beans), dried peas, beans, lentils.  Dairy Yogurt or cheese that contains nuts, seeds, or added fruit.  Beverages Fruit juices with high pulp, prune juice. Caffeinated coffee and teas.  Condiments Coconut, maple syrup, pickles, olives. Sweets and  Desserts Desserts, cookies, or candies that contain nuts or coconut, chunky peanut butter, dried fruits. Jams, preserves with seeds, marmalade. Large amounts of sugar and sweets. Any other dessert made with fruits from the not recommended list.  Other Soups made from vegetables that are not recommended or that contain other foods not recommended.  The items listed above may not be a complete list of foods and beverages to avoid. Contact your dietitian for more information.   This information is not intended to replace advice given to you by your health care provider. Make sure you discuss any questions you have with your health care provider.   Document Released: 08/23/2001 Document Revised: 03/08/2013 Document Reviewed: 01/24/2013 Elsevier Interactive Patient Education [employer].

## 2015-08-20 NOTE — Progress Notes (Signed)
CC'D TO PCP °

## 2015-09-03 ENCOUNTER — Telehealth: Payer: Self-pay | Admitting: Internal Medicine

## 2015-09-03 NOTE — Telephone Encounter (Signed)
Spoke with the pt, she said she started having some pain 2 days ago that has gotten worse. She has had two episodes of diverticulitis since March. She is scheduled for a partial colectomy with Dr.Hoxworth on 10/08/15. Her abd is distended and it is tender when she presses on her abd. She has not seen any blood in her stool and yesterday she had 3 loose stools but none today. She is taking some hydrocodone which helps but it makes her sleepy. It also feels better when she lays down instead of sitting. Pt has been watching her diet. Pt wants to know if she needs more abx? She has taken augmentin twice for this. She is concerned that if she is having active diverticulitis that they wont do her surgery next month. Routing to [facility] since [facility] is out of the office.

## 2015-09-03 NOTE — Telephone Encounter (Signed)
PATIENT CALLED STATING THAT SHE WILL BE AT THE NUMBER SHE LEFT ON [employer]ICE [employer]IL UNTIL 12 AND THEN SHE WILL BE AT HOME. FEELS SO BAD SHE IS LEAVING WORK

## 2015-09-04 ENCOUNTER — Telehealth: Payer: Self-pay | Admitting: Internal Medicine

## 2015-09-04 NOTE — Telephone Encounter (Signed)
Pt called asking about being prescribed an antibiotic. She is leaving for the back and asked for the nurse to call her on her cell [phone] and [place] beach she will need the prescription sent to a pharmacy in [facility].

## 2015-09-05 MED ORDER — AMOXICILLIN-POT CLAVULANATE 875-125 MG PO TABS: 1.0000 | ORAL_TABLET | Freq: Two times a day (BID) | ORAL | Status: DC

## 2015-09-05 NOTE — Addendum Note (Signed)
Addended by: Claudina Lick on: 09/05/2015 09:40 AM   Modules accepted: Orders

## 2015-09-05 NOTE — Telephone Encounter (Signed)
She can make contact with Dr. Excell Seltzer and see what he says

## 2015-09-05 NOTE — Telephone Encounter (Signed)
Pt is aware. rx has been sent to the [facility] in [facility] per pt request.   Pt also asked if she should have her surgery moved up sooner or do you think the end of July is soon enough?

## 2015-09-05 NOTE — Telephone Encounter (Signed)
See other phone note

## 2015-09-05 NOTE — Telephone Encounter (Signed)
Please prescribe Augmentin 875 twice a day 10 days. Clear liquid diet 2 days and advance to low-residue. If high fever or worsening of pain, come to the emergency room. No refills.

## 2015-09-06 NOTE — Telephone Encounter (Signed)
Tried to call pt- NA- LMOM with recommendations.  

## 2015-09-10 MED FILL — LOSARTAN POTASSIUM 100 MG T: 100 | 90 days supply | Qty: 90 | Fill #0

## 2015-09-10 MED FILL — FLUoxetine HCL 20 MG CAPS: 20 | 90 days supply | Qty: 90 | Fill #0

## 2015-09-17 MED FILL — SIMVASTATIN 80 MG TABLET: 80 | 90 days supply | Qty: 45 | Fill #1

## 2015-10-01 NOTE — Patient Instructions (Addendum)
Elizabeth Li  10/01/2015   Your procedure is scheduled on: Monday 10/08/2015  Report to [facility] Main  Entrance take [place]  elevators to 3rd floor to  [place] at   [place] AM.  Call this number if you have problems the morning of surgery [phone]   Remember: ONLY 1 PERSON MAY GO WITH YOU TO SHORT STAY TO GET  READY MORNING OF [place].   Do not eat food or drink liquids :After Midnight.Saturday 10/06/2015!  FOLLOW BOWEL PREP INSTRUCTIONS FROM DR. HOXWORTH'S OFFICE THE DAY BEFORE SURGERY!  [place] BOWEL PREP Please follow the instructions carefully. It is important to clean out your bowels.  FIVE DAYS PRIOR TO YOUR SURGERY Stop eating any nuts, popcorn, or fruit with seeds. Stop all fiber supplements such as Metamucil, Citrucel, etc.   Hold taking any blood thinning anticoagulation medication (ex: aspirin, warfarin/Coumadin, Plavix, Xarelto, Eliquis, Pradaxa, etc) as recommended by your medical/cardiology doctor  Obtain what you need at a pharmacy of your choice:  -A bottle of MiraLax / Glycolax (288g) - no prescription required  -A large bottle of Gatorade / Powerade (64oz)     DAY PRIOR TO SURGERY   7:00am Drink plenty of clear liquids all day to avoid getting dehydrated (Water, juice, soda, coffee, tea, bouillon, jello, etc.)  10:00am Mix the bottle of MiraLax with the 64-oz bottle of Gatorade.  Drink the Gatorade mixture gradually over the next few hours (8oz glass every 15-30 minutes) until gone. You should finish by 2pm.    MORNING OF SURGERY Remember to not to drink or eat anything that morning  Hold or take medications as recommended by the hospital staff at your Preoperative visit  If you have questions or concerns, please call [place] [phone] during business hours to speak to the clinical staff for advice.   Take these medicines the morning of surgery with A SIP OF WATER:   Prozac                                  You may not have any metal on your body including hair pins and              piercings  Do not wear jewelry, make-up, lotions, powders or perfumes, deodorant             Do not wear nail polish.  Do not shave  48 hours prior to surgery.                 Do not bring valuables to the hospital. [place].  Contacts, dentures or bridgework may not be worn into surgery.  Leave suitcase in the car. After surgery it may be brought to your room.                  Please read over the following fact sheets you were given:Blood Transfusion Information Sheet  _____________________________________________________________________             [facility] - Preparing for Surgery Before surgery, you can play an important role.  Because skin is not sterile, your skin needs to be as free of germs as possible.  You can reduce the number of germs  on your skin by washing with CHG (chlorahexidine gluconate) soap before surgery.  CHG is an antiseptic cleaner which kills germs and bonds with the skin to continue killing germs even after washing. Please DO NOT use if you have an allergy to CHG or antibacterial soaps.  If your skin becomes reddened/irritated stop using the CHG and inform your nurse when you arrive at Short Stay. Do not shave (including legs and underarms) for at least 48 hours prior to the first CHG shower.  You may shave your face/neck. Please follow these instructions carefully:  1.  Shower with CHG Soap the night before surgery and the  morning of Surgery.  2.  If you choose to wash your hair, wash your hair first as usual with your  normal  shampoo.  3.  After you shampoo, rinse your hair and body thoroughly to remove the  shampoo.                           4.  Use CHG as you would any other liquid soap.  You can apply chg directly  to the skin and wash                       Gently with a scrungie or clean washcloth.  5.  Apply  the CHG Soap to your body ONLY FROM THE NECK DOWN.   Do not use on face/ open                           Wound or open sores. Avoid contact with eyes, ears mouth and genitals (private parts).                       Wash face,  Genitals (private parts) with your normal soap.             6.  Wash thoroughly, paying special attention to the area where your surgery  will be performed.  7.  Thoroughly rinse your body with warm water from the neck down.  8.  DO NOT shower/wash with your normal soap after using and rinsing off  the CHG Soap.                9.  Pat yourself dry with a clean towel.            10.  Wear clean pajamas.            11.  Place clean sheets on your bed the night of your first shower and do not  sleep with pets. Day of Surgery : Do not apply any lotions/deodorants the morning of surgery.  Please wear clean clothes to the hospital/surgery center.  FAILURE TO FOLLOW THESE INSTRUCTIONS MAY RESULT IN THE CANCELLATION OF YOUR SURGERY PATIENT SIGNATURE_________________________________  NURSE SIGNATURE__________________________________  ________________________________________________________________________   Elizabeth Li  An incentive spirometer is a tool that can help keep your lungs clear and active. This tool measures how well you are filling your lungs with each breath. Taking long deep breaths may help reverse or decrease the chance of developing breathing (pulmonary) problems (especially infection) following:  A long period of time when you are unable to move or be active. BEFORE THE PROCEDURE   If the spirometer includes an indicator to show your best effort, your nurse or respiratory therapist will set it to a desired goal.  If  possible, sit up straight or lean slightly forward. Try not to slouch.  Hold the incentive spirometer in an upright position. INSTRUCTIONS FOR USE   Sit on the edge of your bed if possible, or sit up as far as you can in bed or on a  chair.  Hold the incentive spirometer in an upright position.  Breathe out normally.  Place the mouthpiece in your mouth and seal your lips tightly around it.  Breathe in slowly and as deeply as possible, raising the piston or the ball toward the top of the column.  Hold your breath for 3-5 seconds or for as long as possible. Allow the piston or ball to fall to the bottom of the column.  Remove the mouthpiece from your mouth and breathe out normally.  Rest for a few seconds and repeat Steps 1 through 7 at least 10 times every 1-2 hours when you are awake. Take your time and take a few normal breaths between deep breaths.  The spirometer may include an indicator to show your best effort. Use the indicator as a goal to work toward during each repetition.  After each set of 10 deep breaths, practice coughing to be sure your lungs are clear. If you have an incision (the cut made at the time of surgery), support your incision when coughing by placing a pillow or rolled up towels firmly against it. Once you are able to get out of bed, walk around indoors and cough well. You may stop using the incentive spirometer when instructed by your caregiver.  RISKS AND COMPLICATIONS  Take your time so you do not get dizzy or light-headed.  If you are in pain, you may need to take or ask for pain medication before doing incentive spirometry. It is harder to take a deep breath if you are having pain. AFTER USE  Rest and breathe slowly and easily.  It can be helpful to keep track of a log of your progress. Your caregiver can provide you with a simple table to help with this. If you are using the spirometer at home, follow these instructions: [place] IF:   You are having difficultly using the spirometer.  You have trouble using the spirometer as often as instructed.  Your pain medication is not giving enough relief while using the spirometer.  You develop fever of 100.5 F (38.1 C) or  higher. SEEK IMMEDIATE MEDICAL CARE IF:   You cough up bloody sputum that had not been present before.  You develop fever of 102 F (38.9 C) or greater.  You develop worsening pain at or near the incision site. MAKE SURE YOU:   Understand these instructions.  Will watch your condition.  Will get help right away if you are not doing well or get worse. Document Released: 07/14/2006 Document Revised: 05/26/2011 Document Reviewed: 09/14/2006 Exit[employer] Patient Information 2014 [employer], [place].   ________________________________________________________________________  WHAT IS A BLOOD TRANSFUSION? Blood Transfusion Information  A transfusion is the replacement of blood or some of its parts. Blood is made up of multiple cells which provide different functions.  Red blood cells carry oxygen and are used for blood loss replacement.  White blood cells fight against infection.  Platelets control bleeding.  Plasma helps clot blood.  Other blood products are available for specialized needs, such as hemophilia or other clotting disorders. BEFORE THE TRANSFUSION  Who gives blood for transfusions?   Healthy volunteers who are fully evaluated to make sure their blood is safe.  This is blood bank blood. Transfusion therapy is the safest it has ever been in the practice of medicine. Before blood is taken from a donor, a complete history is taken to make sure that person has no history of diseases nor engages in risky social behavior (examples are intravenous drug use or sexual activity with multiple partners). The donor's travel history is screened to minimize risk of transmitting infections, such as malaria. The donated blood is tested for signs of infectious diseases, such as HIV and hepatitis. The blood is then tested to be sure it is compatible with you in order to minimize the chance of a transfusion reaction. If you or a relative donates blood, this is often done in anticipation of surgery  and is not appropriate for emergency situations. It takes many days to process the donated blood. RISKS AND COMPLICATIONS Although transfusion therapy is very safe and saves many lives, the main dangers of transfusion include:   Getting an infectious disease.  Developing a transfusion reaction. This is an allergic reaction to something in the blood you were given. Every precaution is taken to prevent this. The decision to have a blood transfusion has been considered carefully by your caregiver before blood is given. Blood is not given unless the benefits outweigh the risks. AFTER THE TRANSFUSION  Right after receiving a blood transfusion, you will usually feel much better and more energetic. This is especially true if your red blood cells have gotten low (anemic). The transfusion raises the level of the red blood cells which carry oxygen, and this usually causes an energy increase.  The nurse administering the transfusion will monitor you carefully for complications. HOME CARE INSTRUCTIONS  No special instructions are needed after a transfusion. You may find your energy is better. Speak with your caregiver about any limitations on activity for underlying diseases you may have. SEEK MEDICAL CARE IF:   Your condition is not improving after your transfusion.  You develop redness or irritation at the intravenous (IV) site. SEEK IMMEDIATE MEDICAL CARE IF:  Any of the following symptoms occur over the next 12 hours:  Shaking chills.  You have a temperature by mouth above 102 F (38.9 C), not controlled by medicine.  Chest, back, or muscle pain.  People around you feel you are not acting correctly or are confused.  Shortness of breath or difficulty breathing.  Dizziness and fainting.  You get a rash or develop hives.  You have a decrease in urine output.  Your urine turns a dark color or changes to pink, red, or brown. Any of the following symptoms occur over the next 10  days:  You have a temperature by mouth above 102 F (38.9 C), not controlled by medicine.  Shortness of breath.  Weakness after normal activity.  The white part of the eye turns yellow (jaundice).  You have a decrease in the amount of urine or are urinating less often.  Your urine turns a dark color or changes to pink, red, or brown. Document Released: 02/29/2000 Document Revised: 05/26/2011 Document Reviewed: 10/18/2007 [facility] Patient Information 2014 [place], [place].  _______________________________________________________________________

## 2015-10-02 ENCOUNTER — Encounter (HOSPITAL_COMMUNITY): Payer: Self-pay

## 2015-10-02 ENCOUNTER — Encounter (HOSPITAL_COMMUNITY)
Admission: RE | Admit: 2015-10-02 | Discharge: 2015-10-02 | Disposition: A | Discharge status: Home / Self Care | Payer: 59 | Source: Ambulatory Visit | Attending: General Surgery | Admitting: General Surgery

## 2015-10-02 DIAGNOSIS — Z01812 Encounter for preprocedural laboratory examination: Secondary | ICD-10-CM | POA: Insufficient documentation

## 2015-10-02 HISTORY — DX: Personal history of urinary (tract) infections: Z87.440

## 2015-10-02 HISTORY — DX: Presence of spectacles and contact lenses: Z97.3

## 2015-10-02 LAB — BASIC METABOLIC PANEL
Anion gap: 7 (ref 5–15)
BUN: 17 mg/dL (ref 6–20)
CALCIUM: 9.1 mg/dL (ref 8.9–10.3)
CHLORIDE: 105 mmol/L (ref 101–111)
CO2: 25 mmol/L (ref 22–32)
CREATININE: 0.5 mg/dL (ref 0.44–1.00)
GFR calc Af Amer: >60 mL/min (ref >60)
GFR calc non Af Amer: >60 mL/min (ref >60)
GLUCOSE: 81 mg/dL (ref 65–99)
Potassium: 3.9 mmol/L (ref 3.5–5.1)
Sodium: 137 mmol/L (ref 135–145)

## 2015-10-02 LAB — CBC
HCT: 39.6 % (ref 36.0–46.0)
HEMOGLOBIN: 13.5 g/dL (ref 12.0–15.0)
MCH: 30.1 pg (ref 26.0–34.0)
MCHC: 34.1 g/dL (ref 30.0–36.0)
MCV: 88.2 fL (ref 78.0–100.0)
PLATELETS: 321 10*3/uL (ref 150–400)
RBC: 4.49 MIL/uL (ref 3.87–5.11)
RDW: 13 % (ref 11.5–15.5)
WBC: 7.8 10*3/uL (ref 4.0–10.5)

## 2015-10-02 LAB — ABO/RH: ABO/RH(D): B NEG

## 2015-10-03 LAB — HEMOGLOBIN A1C
HEMOGLOBIN A1C: 5.4 % (ref 4.8–5.6)
MEAN PLASMA GLUCOSE: 108 mg/dL

## 2015-10-05 MED FILL — LINZESS 72 MCG CAPSULE: 72 | 90 days supply | Qty: 90 | Fill #1

## 2015-10-07 NOTE — Anesthesia Preprocedure Evaluation (Addendum)
Anesthesia Evaluation  Patient identified by MRN, date of birth, ID band Patient awake    Reviewed: Allergy & Precautions, H&P , NPO status , Patient's Chart, lab work & pertinent test results  Airway Mallampati: II  TM Distance: >3 FB Neck ROM: Full    Dental no notable dental hx. (+) Teeth Intact, Dental Advisory Given   Pulmonary neg pulmonary ROS,    Pulmonary exam normal breath sounds clear to auscultation       Cardiovascular hypertension, Pt. on medications  Rhythm:Regular Rate:Normal     Neuro/Psych negative neurological ROS  negative psych ROS   GI/Hepatic negative GI ROS, Neg liver ROS,   Endo/Other  negative endocrine ROS  Renal/GU negative Renal ROS  negative genitourinary   Musculoskeletal  (+) Arthritis ,   Abdominal   Peds  Hematology negative hematology ROS (+)   Anesthesia Other Findings   Reproductive/Obstetrics negative OB ROS                            Anesthesia Physical Anesthesia Plan  ASA: II  Anesthesia Plan: General   Post-op Pain Management:    Induction: Intravenous  Airway Management Planned: Oral ETT  Additional Equipment:   Intra-op Plan:   Post-operative Plan: Extubation in OR  Informed Consent: I have reviewed the patients History and Physical, chart, labs and discussed the procedure including the risks, benefits and alternatives for the proposed anesthesia with the patient or authorized representative who has indicated his/her understanding and acceptance.   Dental advisory given  Plan Discussed with: CRNA  Anesthesia Plan Comments:         Anesthesia Quick Evaluation

## 2015-10-08 ENCOUNTER — Inpatient Hospital Stay (HOSPITAL_COMMUNITY): Payer: 59 | Admitting: Anesthesiology

## 2015-10-08 ENCOUNTER — Inpatient Hospital Stay (HOSPITAL_COMMUNITY)
Admission: RE | Admit: 2015-10-08 | Discharge: 2015-10-11 | DRG: 331 | Disposition: A | Discharge status: Home / Self Care | Payer: 59 | Source: Ambulatory Visit | Attending: General Surgery | Admitting: General Surgery

## 2015-10-08 ENCOUNTER — Encounter (HOSPITAL_COMMUNITY): Payer: Self-pay

## 2015-10-08 ENCOUNTER — Encounter (HOSPITAL_COMMUNITY)
Admission: RE | Disposition: A | Discharge status: Home / Self Care | Payer: Self-pay | Source: Ambulatory Visit | Attending: General Surgery

## 2015-10-08 DIAGNOSIS — R1032 Left lower quadrant pain: Secondary | ICD-10-CM | POA: Diagnosis present

## 2015-10-08 DIAGNOSIS — Z86010 Personal history of colonic polyps: Secondary | ICD-10-CM | POA: Diagnosis not present

## 2015-10-08 DIAGNOSIS — M1990 Unspecified osteoarthritis, unspecified site: Secondary | ICD-10-CM | POA: Diagnosis present

## 2015-10-08 DIAGNOSIS — K5732 Diverticulitis of large intestine without perforation or abscess without bleeding: Secondary | ICD-10-CM | POA: Diagnosis not present

## 2015-10-08 DIAGNOSIS — Z79899 Other long term (current) drug therapy: Secondary | ICD-10-CM

## 2015-10-08 DIAGNOSIS — Z8249 Family history of ischemic heart disease and other diseases of the circulatory system: Secondary | ICD-10-CM | POA: Diagnosis not present

## 2015-10-08 DIAGNOSIS — E7800 Pure hypercholesterolemia, unspecified: Secondary | ICD-10-CM | POA: Diagnosis present

## 2015-10-08 DIAGNOSIS — K5909 Other constipation: Secondary | ICD-10-CM | POA: Diagnosis present

## 2015-10-08 DIAGNOSIS — K589 Irritable bowel syndrome without diarrhea: Secondary | ICD-10-CM | POA: Diagnosis present

## 2015-10-08 DIAGNOSIS — R110 Nausea: Secondary | ICD-10-CM | POA: Diagnosis not present

## 2015-10-08 DIAGNOSIS — N736 Female pelvic peritoneal adhesions (postinfective): Secondary | ICD-10-CM | POA: Diagnosis present

## 2015-10-08 DIAGNOSIS — K5792 Diverticulitis of intestine, part unspecified, without perforation or abscess without bleeding: Secondary | ICD-10-CM | POA: Diagnosis not present

## 2015-10-08 DIAGNOSIS — R030 Elevated blood-pressure reading, without diagnosis of hypertension: Secondary | ICD-10-CM | POA: Diagnosis present

## 2015-10-08 DIAGNOSIS — Z809 Family history of malignant neoplasm, unspecified: Secondary | ICD-10-CM | POA: Diagnosis not present

## 2015-10-08 HISTORY — PX: LAPAROSCOPIC PARTIAL COLECTOMY: SHX5907

## 2015-10-08 LAB — TYPE AND SCREEN
ABO/RH(D): B NEG
Antibody Screen: NEGATIVE

## 2015-10-08 SURGERY — LAPAROSCOPIC PARTIAL COLECTOMY
Anesthesia: General | Site: Abdomen

## 2015-10-08 MED ORDER — POLYETHYLENE GLYCOL 3350 17 GM/SCOOP PO POWD: 1.0000 | Freq: Once | ORAL | Status: DC

## 2015-10-08 MED ORDER — CHLORHEXIDINE GLUCONATE CLOTH 2 % EX PADS: 6.0000 | MEDICATED_PAD | Freq: Once | CUTANEOUS | Status: DC

## 2015-10-08 MED ORDER — HYDROMORPHONE HCL 1 MG/ML IJ SOLN
INTRAMUSCULAR | Status: AC
  Filled 2015-10-08: qty 1

## 2015-10-08 MED ORDER — SODIUM CHLORIDE 0.9 % IJ SOLN
INTRAMUSCULAR | Status: AC
  Filled 2015-10-08: qty 50

## 2015-10-08 MED ORDER — ALVIMOPAN 12 MG PO CAPS
12.0000 mg | ORAL_CAPSULE | Freq: Two times a day (BID) | ORAL | Status: DC
  Administered 2015-10-09 – 2015-10-10 (×4): 12 mg via ORAL
  Filled 2015-10-08 (×4): qty 1

## 2015-10-08 MED ORDER — DEXTROSE 5 % IV SOLN
2.0000 g | INTRAVENOUS | Status: AC
  Administered 2015-10-08: 2 g via INTRAVENOUS
  Filled 2015-10-08: qty 2

## 2015-10-08 MED ORDER — HYDROMORPHONE HCL 1 MG/ML IJ SOLN
0.2500 mg | INTRAMUSCULAR | Status: DC | PRN
  Administered 2015-10-08: 0.5 mg via INTRAVENOUS

## 2015-10-08 MED ORDER — LACTATED RINGERS IR SOLN
Status: DC | PRN
  Administered 2015-10-08: 1000 mL

## 2015-10-08 MED ORDER — ONDANSETRON HCL 4 MG/2ML IJ SOLN
INTRAMUSCULAR | Status: DC | PRN
  Administered 2015-10-08: 4 mg via INTRAVENOUS

## 2015-10-08 MED ORDER — OXYCODONE-ACETAMINOPHEN 5-325 MG PO TABS
1.0000 | ORAL_TABLET | ORAL | Status: DC | PRN
  Administered 2015-10-08 – 2015-10-09 (×2): 1 via ORAL
  Administered 2015-10-09: 2 via ORAL
  Administered 2015-10-09: 1 via ORAL
  Administered 2015-10-09 – 2015-10-11 (×6): 2 via ORAL
  Filled 2015-10-08: qty 2
  Filled 2015-10-08: qty 1
  Filled 2015-10-08 (×3): qty 2
  Filled 2015-10-08: qty 1
  Filled 2015-10-08 (×4): qty 2

## 2015-10-08 MED ORDER — EPHEDRINE SULFATE 50 MG/ML IJ SOLN
INTRAMUSCULAR | Status: AC
  Filled 2015-10-08: qty 1

## 2015-10-08 MED ORDER — SODIUM CHLORIDE 0.9 % IJ SOLN
INTRAMUSCULAR | Status: AC
  Filled 2015-10-08: qty 10

## 2015-10-08 MED ORDER — LACTATED RINGERS IV SOLN: INTRAVENOUS | Status: DC

## 2015-10-08 MED ORDER — ONDANSETRON HCL 4 MG/2ML IJ SOLN
4.0000 mg | Freq: Four times a day (QID) | INTRAMUSCULAR | Status: DC | PRN
  Administered 2015-10-08: 4 mg via INTRAVENOUS
  Filled 2015-10-08: qty 2

## 2015-10-08 MED ORDER — ONDANSETRON HCL 4 MG/2ML IJ SOLN
INTRAMUSCULAR | Status: AC
  Filled 2015-10-08: qty 2

## 2015-10-08 MED ORDER — SIMVASTATIN 40 MG PO TABS
40.0000 mg | ORAL_TABLET | Freq: Every day | ORAL | Status: DC
  Administered 2015-10-09 – 2015-10-10 (×2): 40 mg via ORAL
  Filled 2015-10-08 (×3): qty 1

## 2015-10-08 MED ORDER — LIDOCAINE HCL (CARDIAC) 20 MG/ML IV SOLN
INTRAVENOUS | Status: AC
  Filled 2015-10-08: qty 5

## 2015-10-08 MED ORDER — SUGAMMADEX SODIUM 200 MG/2ML IV SOLN
INTRAVENOUS | Status: DC | PRN
  Administered 2015-10-08: 150 mg via INTRAVENOUS

## 2015-10-08 MED ORDER — PROMETHAZINE HCL 25 MG/ML IJ SOLN
12.5000 mg | INTRAMUSCULAR | Status: DC | PRN
Start: 2015-10-08 — End: 2015-10-11
  Administered 2015-10-08: 12.5 mg via INTRAVENOUS
  Filled 2015-10-08: qty 1

## 2015-10-08 MED ORDER — FENTANYL CITRATE (PF) 250 MCG/5ML IJ SOLN
INTRAMUSCULAR | Status: AC
  Filled 2015-10-08: qty 5

## 2015-10-08 MED ORDER — ROCURONIUM BROMIDE 100 MG/10ML IV SOLN
INTRAVENOUS | Status: AC
  Filled 2015-10-08: qty 1

## 2015-10-08 MED ORDER — DEXAMETHASONE SODIUM PHOSPHATE 10 MG/ML IJ SOLN
INTRAMUSCULAR | Status: AC
  Filled 2015-10-08: qty 1

## 2015-10-08 MED ORDER — 0.9 % SODIUM CHLORIDE (POUR BTL) OPTIME
TOPICAL | Status: DC | PRN
Start: 2015-10-08 — End: 2015-10-08
  Administered 2015-10-08: 3000 mL

## 2015-10-08 MED ORDER — MIDAZOLAM HCL 2 MG/2ML IJ SOLN
INTRAMUSCULAR | Status: AC
  Filled 2015-10-08: qty 2

## 2015-10-08 MED ORDER — BUPIVACAINE LIPOSOME 1.3 % IJ SUSP
20.0000 mL | Freq: Once | INTRAMUSCULAR | Status: AC
  Administered 2015-10-08: 20 mL
  Filled 2015-10-08: qty 20

## 2015-10-08 MED ORDER — PROPOFOL 10 MG/ML IV BOLUS
INTRAVENOUS | Status: AC
  Filled 2015-10-08: qty 20

## 2015-10-08 MED ORDER — ROCURONIUM BROMIDE 100 MG/10ML IV SOLN
INTRAVENOUS | Status: DC | PRN
  Administered 2015-10-08 (×3): 10 mg via INTRAVENOUS
  Administered 2015-10-08: 50 mg via INTRAVENOUS

## 2015-10-08 MED ORDER — CEFAZOLIN SODIUM-DEXTROSE 2-4 GM/100ML-% IV SOLN
INTRAVENOUS | Status: AC
  Filled 2015-10-08: qty 100

## 2015-10-08 MED ORDER — EPHEDRINE SULFATE 50 MG/ML IJ SOLN
INTRAMUSCULAR | Status: DC | PRN
  Administered 2015-10-08: 15 mg via INTRAVENOUS
  Administered 2015-10-08: 10 mg via INTRAVENOUS

## 2015-10-08 MED ORDER — ALVIMOPAN 12 MG PO CAPS
12.0000 mg | ORAL_CAPSULE | Freq: Once | ORAL | Status: AC
  Administered 2015-10-08: 12 mg via ORAL
  Filled 2015-10-08: qty 1

## 2015-10-08 MED ORDER — PROPOFOL 10 MG/ML IV BOLUS
INTRAVENOUS | Status: DC | PRN
  Administered 2015-10-08: 120 mg via INTRAVENOUS
  Administered 2015-10-08: 80 mg via INTRAVENOUS

## 2015-10-08 MED ORDER — LIDOCAINE HCL (CARDIAC) 20 MG/ML IV SOLN
INTRAVENOUS | Status: DC | PRN
  Administered 2015-10-08: 60 mg via INTRAVENOUS

## 2015-10-08 MED ORDER — ATROPINE SULFATE 0.4 MG/ML IJ SOLN
INTRAMUSCULAR | Status: AC
  Filled 2015-10-08: qty 1

## 2015-10-08 MED ORDER — LOSARTAN POTASSIUM 50 MG PO TABS
100.0000 mg | ORAL_TABLET | Freq: Every day | ORAL | Status: DC
  Administered 2015-10-09 – 2015-10-10 (×2): 100 mg via ORAL
  Filled 2015-10-08 (×3): qty 2

## 2015-10-08 MED ORDER — FENTANYL CITRATE (PF) 100 MCG/2ML IJ SOLN
INTRAMUSCULAR | Status: DC | PRN
  Administered 2015-10-08 (×4): 50 ug via INTRAVENOUS
  Administered 2015-10-08: 100 ug via INTRAVENOUS
  Administered 2015-10-08 (×2): 50 ug via INTRAVENOUS

## 2015-10-08 MED ORDER — ONDANSETRON HCL 4 MG PO TABS: 4.0000 mg | ORAL_TABLET | Freq: Four times a day (QID) | ORAL | Status: DC | PRN

## 2015-10-08 MED ORDER — LACTATED RINGERS IV SOLN
INTRAVENOUS | Status: DC | PRN
  Administered 2015-10-08 (×3): via INTRAVENOUS

## 2015-10-08 MED ORDER — SODIUM CHLORIDE 0.9 % IJ SOLN
INTRAMUSCULAR | Status: DC | PRN
  Administered 2015-10-08: 20 mL

## 2015-10-08 MED ORDER — GLYCOPYRROLATE 0.2 MG/ML IJ SOLN
INTRAMUSCULAR | Status: AC
  Filled 2015-10-08: qty 1

## 2015-10-08 MED ORDER — DEXAMETHASONE SODIUM PHOSPHATE 10 MG/ML IJ SOLN
INTRAMUSCULAR | Status: DC | PRN
  Administered 2015-10-08: 10 mg via INTRAVENOUS

## 2015-10-08 MED ORDER — HYDROMORPHONE HCL 1 MG/ML IJ SOLN
0.2500 mg | INTRAMUSCULAR | Status: DC | PRN
  Administered 2015-10-08 (×4): 0.5 mg via INTRAVENOUS

## 2015-10-08 MED ORDER — MORPHINE SULFATE (PF) 2 MG/ML IV SOLN
2.0000 mg | INTRAVENOUS | Status: DC | PRN
  Administered 2015-10-08 (×2): 2 mg via INTRAVENOUS
  Administered 2015-10-08 (×3): 4 mg via INTRAVENOUS
  Administered 2015-10-08: 2 mg via INTRAVENOUS
  Administered 2015-10-09: 4 mg via INTRAVENOUS
  Administered 2015-10-09 (×2): 2 mg via INTRAVENOUS
  Administered 2015-10-10 (×3): 4 mg via INTRAVENOUS
  Filled 2015-10-08: qty 1
  Filled 2015-10-08 (×3): qty 2
  Filled 2015-10-08: qty 1
  Filled 2015-10-08 (×2): qty 2
  Filled 2015-10-08: qty 1
  Filled 2015-10-08 (×2): qty 2
  Filled 2015-10-08 (×2): qty 1

## 2015-10-08 MED ORDER — BUPIVACAINE-EPINEPHRINE 0.25% -1:200000 IJ SOLN
INTRAMUSCULAR | Status: DC | PRN
  Administered 2015-10-08: 20 mL

## 2015-10-08 MED ORDER — KCL IN DEXTROSE-NACL 20-5-0.9 MEQ/L-%-% IV SOLN
INTRAVENOUS | Status: DC
  Administered 2015-10-08 – 2015-10-10 (×6): via INTRAVENOUS
  Filled 2015-10-08 (×7): qty 1000

## 2015-10-08 MED ORDER — BUPIVACAINE-EPINEPHRINE 0.25% -1:200000 IJ SOLN
INTRAMUSCULAR | Status: AC
  Filled 2015-10-08: qty 1

## 2015-10-08 MED ORDER — FLUOXETINE HCL 20 MG PO CAPS
20.0000 mg | ORAL_CAPSULE | Freq: Every day | ORAL | Status: DC
  Administered 2015-10-09 – 2015-10-10 (×2): 20 mg via ORAL
  Filled 2015-10-08 (×3): qty 1

## 2015-10-08 MED ORDER — SUGAMMADEX SODIUM 200 MG/2ML IV SOLN
INTRAVENOUS | Status: AC
  Filled 2015-10-08: qty 2

## 2015-10-08 MED ORDER — ENOXAPARIN SODIUM 40 MG/0.4ML ~~LOC~~ SOLN
40.0000 mg | SUBCUTANEOUS | Status: DC
  Administered 2015-10-09 – 2015-10-10 (×2): 40 mg via SUBCUTANEOUS
  Filled 2015-10-08 (×2): qty 0.4

## 2015-10-08 MED ORDER — MIDAZOLAM HCL 5 MG/5ML IJ SOLN
INTRAMUSCULAR | Status: DC | PRN
  Administered 2015-10-08: 2 mg via INTRAVENOUS

## 2015-10-08 SURGICAL SUPPLY — 70 items
APPLIER CLIP ROT 10 11.4 M/L (STAPLE)
BLADE EXTENDED COATED 6.5IN (ELECTRODE) IMPLANT
CABLE HIGH FREQUENCY MONO STRZ (ELECTRODE) IMPLANT
CELLS DAT CNTRL 66122 CELL SVR (MISCELLANEOUS) IMPLANT
CHLORAPREP W/TINT 26ML (MISCELLANEOUS) ×3 IMPLANT
CLIP APPLIE ROT 10 11.4 M/L (STAPLE) IMPLANT
COUNTER NEEDLE 20 DBL MAG RED (NEEDLE) ×3 IMPLANT
COVER SURGICAL LIGHT HANDLE (MISCELLANEOUS) ×3 IMPLANT
CUTTER FLEX LINEAR 45M (STAPLE) ×3 IMPLANT
DECANTER SPIKE VIAL GLASS SM (MISCELLANEOUS) ×3 IMPLANT
DRAIN CHANNEL 19F RND (DRAIN) IMPLANT
DRAPE LAPAROSCOPIC ABDOMINAL (DRAPES) ×3 IMPLANT
DRAPE UTILITY XL STRL (DRAPES) ×3 IMPLANT
DRSG OPSITE POSTOP 4X10 (GAUZE/BANDAGES/DRESSINGS) IMPLANT
DRSG OPSITE POSTOP 4X6 (GAUZE/BANDAGES/DRESSINGS) ×3 IMPLANT
DRSG OPSITE POSTOP 4X8 (GAUZE/BANDAGES/DRESSINGS) IMPLANT
ELECT PENCIL ROCKER SW 15FT (MISCELLANEOUS) ×6 IMPLANT
ELECT REM PT RETURN 9FT ADLT (ELECTROSURGICAL) ×3
ELECTRODE REM PT RTRN 9FT ADLT (ELECTROSURGICAL) ×1 IMPLANT
GAUZE SPONGE 4X4 12PLY STRL (GAUZE/BANDAGES/DRESSINGS) IMPLANT
GLOVE BIOGEL PI IND STRL 7.5 (GLOVE) ×2 IMPLANT
GLOVE BIOGEL PI INDICATOR 7.5 (GLOVE) ×4
GLOVE ECLIPSE 7.5 STRL STRAW (GLOVE) ×6 IMPLANT
GOWN STRL REUS W/TWL XL LVL3 (GOWN DISPOSABLE) ×12 IMPLANT
HANDLE SUCTION POOLE (INSTRUMENTS) ×1 IMPLANT
IRRIG SUCT STRYKERFLOW 2 WTIP (MISCELLANEOUS) ×3
IRRIGATION SUCT STRKRFLW 2 WTP (MISCELLANEOUS) ×1 IMPLANT
LEGGING LITHOTOMY PAIR STRL (DRAPES) ×3 IMPLANT
LIGASURE IMPACT 36 18CM CVD LR (INSTRUMENTS) IMPLANT
LIQUID BAND (GAUZE/BANDAGES/DRESSINGS) ×3 IMPLANT
PACK COLON (CUSTOM PROCEDURE TRAY) ×3 IMPLANT
PACK GENERAL/GYN (CUSTOM PROCEDURE TRAY) ×3 IMPLANT
PAD POSITIONING PINK XL (MISCELLANEOUS) IMPLANT
PORT LAP GEL ALEXIS MED 5-9CM (MISCELLANEOUS) ×3 IMPLANT
POSITIONER SURGICAL ARM (MISCELLANEOUS) IMPLANT
RELOAD 45 VASCULAR/THIN (ENDOMECHANICALS) ×3 IMPLANT
RELOAD BLUE CHELON 45 (STAPLE) IMPLANT
RELOAD STAPLE TA45 3.5 REG BLU (ENDOMECHANICALS) ×6 IMPLANT
RTRCTR WOUND ALEXIS 18CM MED (MISCELLANEOUS)
SCISSORS LAP 5X35 DISP (ENDOMECHANICALS) ×3 IMPLANT
SHEARS HARMONIC ACE PLUS 36CM (ENDOMECHANICALS) IMPLANT
SHEARS HARMONIC HDI 36CM (ELECTROSURGICAL) ×3 IMPLANT
SLEEVE ADV FIXATION 5X100MM (TROCAR) ×6 IMPLANT
SOLUTION ANTI FOG 6CC (MISCELLANEOUS) ×3 IMPLANT
STAPLER CIRC CVD 29MM 37CM (STAPLE) ×3 IMPLANT
STAPLER VISISTAT 35W (STAPLE) ×3 IMPLANT
SUCTION POOLE HANDLE (INSTRUMENTS) ×3
SUT PDS AB 0 CT1 36 (SUTURE) ×9 IMPLANT
SUT PDS AB 1 CTX 36 (SUTURE) IMPLANT
SUT PDS AB 1 TP1 96 (SUTURE) IMPLANT
SUT PROLENE 2 0 KS (SUTURE) IMPLANT
SUT PROLENE 3 0 SH 48 (SUTURE) IMPLANT
SUT SILK 2 0 (SUTURE) ×2
SUT SILK 2 0 SH CR/8 (SUTURE) ×3 IMPLANT
SUT SILK 2-0 18XBRD TIE 12 (SUTURE) ×1 IMPLANT
SUT SILK 3 0 (SUTURE) ×2
SUT SILK 3 0 SH CR/8 (SUTURE) ×3 IMPLANT
SUT SILK 3-0 18XBRD TIE 12 (SUTURE) ×1 IMPLANT
SYS LAPSCP GELPORT 120MM (MISCELLANEOUS)
SYSTEM LAPSCP GELPORT 120MM (MISCELLANEOUS) IMPLANT
TAPE CLOTH 4X10 WHT NS (GAUZE/BANDAGES/DRESSINGS) IMPLANT
TOWEL OR 17X26 10 PK STRL BLUE (TOWEL DISPOSABLE) ×6 IMPLANT
TOWEL OR NON WOVEN STRL DISP B (DISPOSABLE) ×6 IMPLANT
TRAY FOLEY W/METER SILVER 14FR (SET/KITS/TRAYS/PACK) ×3 IMPLANT
TRAY FOLEY W/METER SILVER 16FR (SET/KITS/TRAYS/PACK) ×3 IMPLANT
TROCAR ADV FIXATION 12X100MM (TROCAR) ×3 IMPLANT
TROCAR ADV FIXATION 5X100MM (TROCAR) ×3 IMPLANT
TROCAR BLADELESS OPT 5 100 (ENDOMECHANICALS) ×3 IMPLANT
TROCAR XCEL BLUNT TIP 100MML (ENDOMECHANICALS) ×3 IMPLANT
TUBING INSUF HEATED (TUBING) ×3 IMPLANT

## 2015-10-08 NOTE — Anesthesia Procedure Notes (Signed)
Procedure Name: Intubation Date/Time: 10/08/2015 7:40 AM Performed by: Gean Maidens Pre-anesthesia Checklist: Patient identified, Emergency Drugs available, Suction available and Patient being monitored Patient Re-evaluated:Patient Re-evaluated prior to inductionOxygen Delivery Method: Circle system utilized Preoxygenation: Pre-oxygenation with 100% oxygen Intubation Type: IV induction Ventilation: Mask ventilation without difficulty Laryngoscope Size: Miller and 2 Grade View: Grade I Tube type: Oral Tube size: 7.0 mm Number of attempts: 1 Airway Equipment and Method: Stylet Placement Confirmation: ETT inserted through vocal cords under direct vision,  positive ETCO2 and breath sounds checked- equal and bilateral Secured at: 23 cm Tube secured with: Tape Dental Injury: Teeth and Oropharynx as per pre-operative assessment

## 2015-10-08 NOTE — Op Note (Signed)
Preoperative Diagnosis: diverticulitis sigmoid colon  Postoprative Diagnosis: diverticulitis sigmoid colon  Procedure: Procedure(s): LAPAROSCOPIC sigmoid COLECTOMY with takedown of splenic flexure   Surgeon: Excell Seltzer T   Assistants: Jackolyn Confer  Anesthesia:  General endotracheal anesthesia  Indications: patient is a 60 year old female with a previous history of diverticulitis of the left colon. In April of this year she had a significant episode of diverticulitis with CT scan showing inflammatory change at the proximal sigmoid colon. Since that time she has been on repeated courses of antibiotics with recurrent and persistent significant left lower quadrant pain and change in bowel habits. Symptoms have never completely resolved. After consultation in the office regarding alternatives and risks we have elected to proceed with  Laparoscopic sigmoid colectomy for treatment of her ongoing diverticulitis.    Procedure Detail:  Patient had undergone a mechanical and antibiotic bowel prep at home. She was brought to the operating room, placed in the supine position on the operating table, and general endotracheal anesthesia induced. She was given preoperative IV antibiotics. She was carefully padded and secured to the table and placed in  Lithotomy position. Foley catheter and orogastric tube were placed. The abdomen was widely sterilely prepped and draped. Patient timeout was performed and correct procedure verified. Access was obtained with a 1 cm incision at the umbilicuswith an open Hassan technique through a mattress suture of 0 Vicryl. Under direct vision a 12 mm trocar was placed in the right lower quadrant just medial to the anterior superior iliac spine and a 5 mm trocar in the right mid abdomen.  2   5 mm trochars were placed in the left abdomen in mirror-image positions.  The colon was carefully examined.  There was some significant inflammatory change in the proximal to mid  sigmoid colon with some inflammatory adhesions to the left pelvic sidewall that were easily taken down with blunt dissection. There was a significantly thickened and inflamed area of sigmoid in this position. Distal rectosigmoid appeared normal. There was some thickening and diverticulosis of the more proximal sigmoid and distal left colon and normal-appearing left colon with a few scattered diverticula.  Initially the base of the sigmoid mesentery was exposed from the medial aspect of the peritoneum incised just above the sacral promontory. Careful blunt dissection was carried posterior to the rectosigmoid mesentery. The iliac vessels were identified and the left ureter was clearly identified and dissected out and away from the mesentery. The inferior mesenteric pedicle was then dissected and bluntly encircled above the ureter with the ureter in view and was divided with a single firing of the 45 mm white load stapler. Protecting the ureter further mesentery was dissected with the Harmonic scalpel working medial to lateral over toward the white line of Toldt. After thorough mobilization of the mesentery we went lateral and divided  Peritoneum along the line of Toldt and further mobilized the sigmoid and rectosigmoid back to the plane of dissection. Developed medially. Again the ureter was seen throughout its course and protected. Following this the more proximal left colon was mobilized dividing lateral peritoneal attachments. The  Splenic flexure was taken down. The omentum was elevated with the patient in reverse Trendelenburg  And the distal transverse colon mobilized off the omentum and reflected inferiorly and medially. The splenic flexure was taken down dividing the splenocolic ligament with Harmonic scalpel and dissecting the mesentery of the splenic flexure and proximal left colon well off introitus fascia. We then worked distally further mobilizing the left colon back to  the midline with complete  mobilization. A point of distal resection was chosen at the proximal rectum where there were no tinea and the mesentery was dissected with Harmonic scalpel and blunt dissection carried back posteriorly around the proximal rectum which was completely cleared. This was then divided with 2 firings of the 45 mm blue load stapler. Finally a small amount of remaining mesentery of the distal sigmoid colon was divided with Harmonic scalpel again ensuring the ureter was in view and protected. At this point I made a small, 4 cm incision in the left lower quadrant and one of the 5 mm trocar sites and a muscle-splitting incision was used and the peritoneal cavity entered and wound protector placed. The divided end of the  Proximal bowel was brought out with very good length up to the  Mid left colon where the bowel was very soft and normal-appearing.. This reached easily to the pubic symphysis and the mesentery was further resected clamping and tying with 2-0 silk ties up to this point of resection proximally. This was clamped with a pursestring clamp and a 2-0 Prolene pursestring suture placed and the bowel divided and specimen removed.  The end of the colon was sized to 29 mm and astealth  29 mm EEA anvil was placed and the pursestring suture secured. A small amount of mesentery was cleared from the bowel wall to allow expose serosa at the staple line. The end was returned into the abdominal cavity and we return to laparoscopic view. Dr. Zella Richer went below and the 29 mm EEA stapler was advanced per rectum proximally to the  Rectosigmoid staple lineand the spike deployed just  Anterior to the midpoint of the staple line. The anvil was attached and the stapler closed excluding any extraneous tissue. It was fired and removed easily. The anastomosis was under no tension and appeared to have excellent blood supply. The proximal bowel was clamped and Dr. Zella Richer tensely insufflated the rectum and anastomosis under saline  irrigation and there was no evidence of leak.  At this point all gloves gowns and instruments were changed. The abdomen had been inspected for hemostasis and there was no evidence of bleeding or injury or other problems. The left lower quadrant incision was closed in layers with running 0 PDS. Skin was closed with subcuticular 4-0 Monocryl and Liquiban. Sponge needle and instrument counts were correct.    Findings: Sigmoid diverticulitis  Estimated Blood Loss:  less than 50 mL         Drains: nnone  Blood Given: none          Specimens: left and sigmoid colon        Complications:  * No complications entered in OR log *         Disposition: PACU - hemodynamically stable.         Condition: stable

## 2015-10-08 NOTE — Progress Notes (Signed)
Pt continues to have pain and nausea unrelieved by current PRN's. MD on call paged. Orders received. Will continue to monitor. Anthonette Lesage A

## 2015-10-08 NOTE — Anesthesia Postprocedure Evaluation (Signed)
Anesthesia Post Note  Patient: Elizabeth Li  Procedure(s) Performed: Procedure(s) (LRB): LAPAROSCOPIC PARTIAL COLECTOMY (N/A)  Patient location during evaluation: PACU Anesthesia Type: General Level of consciousness: awake and alert Pain management: pain level controlled Vital Signs Assessment: post-procedure vital signs reviewed and stable Respiratory status: spontaneous breathing, nonlabored ventilation, respiratory function stable and patient connected to nasal cannula oxygen Cardiovascular status: blood pressure returned to baseline and stable Postop Assessment: no signs of nausea or vomiting Anesthetic complications: no    Last Vitals:  Vitals:   10/08/15 1215 10/08/15 1250  BP:  (!) 170/76  Pulse:  76  Resp:  16  Temp: 36.6 C 36.5 C    Last Pain:  Vitals:   10/08/15 1259  TempSrc:   PainSc: 8                  Rivka Baune,W. EDMOND

## 2015-10-08 NOTE — Transfer of Care (Signed)
Immediate Anesthesia Transfer of Care Note  Patient: Elizabeth Li  Procedure(s) Performed: Procedure(s): LAPAROSCOPIC PARTIAL COLECTOMY (N/A)  Patient Location: PACU  Anesthesia Type:General  Level of Consciousness: sedated  Airway & Oxygen Therapy: Patient Spontanous Breathing and Patient connected to face mask oxygen  Post-op Assessment: Report given to RN and Post -op Vital signs reviewed and stable  Post vital signs: Reviewed and stable  Last Vitals:  Vitals:   10/08/15 0544  BP: 139/76  Pulse: 66  Resp: 16  Temp: 36.4 C    Last Pain:  Vitals:   10/08/15 0544  TempSrc: Oral         Complications: No apparent anesthesia complications

## 2015-10-08 NOTE — H&P (Signed)
History of Present Illness    The patient is a 60 year old female who presents with diverticulitis. She is a very pleasant 60 year old female patient of Dr. Wende Neighbors and Dr. Gala Romney who presents with persistent/recurrent diverticulitis. She was in her usual state of health until 2015 when she had an initial episode of diverticulitis confirmed with CT scan. This was treated with outpatient antibiotics and gradually resolved. She has never required hospitalization. She did pretty well until April 2017. She states she would get some occasional left lower quadrant pain but it was brief and not severe and did not require medical attention. She does have history of long-standing chronic constipation and occasional intermittent diarrhea eventually consistent with irritable bowel syndrome. In April of this year she again developed acute left lower quadrant pain. CT scan was obtained July 09, 2015 which showed diverticulosis involving the descending and sigmoid colon with a focal inflammatory process in the left lower quadrant at the junction of the distal left colon and sigmoid with thickening of the colonic wall and pericolonic stranding. No evidence of perforation. She was treated with a course of oral antibiotics and improved but felt like she never really got over the episode. She remained somewhat uncomfortable and tender with some intermittent diarrhea. She developed recurrent more significant pain yesterday. She was seen in Dr. Guerry Minors office and started on Augmentin. She had a colonoscopy in September 2016 showing only diverticulosis. Previous colonoscopy in [place] that apparently showed benign polyps. She generally is in good health.   Other Problems  Arthritis Diverticulosis High blood pressure Hypercholesterolemia  Past Surgical History  Colon Polyp Removal - Colonoscopy Foot Surgery Bilateral. Oral Surgery Tonsillectomy  Diagnostic Studies History   Mammogram within last year Pap Smear 1-5 years ago  Allergies  Benadryl *ANTIHISTAMINES* Flagyl *ANTI-INFECTIVE AGENTS - MISC.*  Medication History  Amoxicillin-Pot Clavulanate (875-125MG  Tablet, Oral) Active. Losartan Potassium (100MG  Tablet, Oral) Active. Simvastatin (20MG  Tablet, Oral) Active. FLUoxetine HCl (20MG  Capsule, Oral) Active. Medications Reconciled  Social History  Alcohol use Occasional alcohol use. Caffeine use Coffee. No drug use Tobacco use Never smoker.  Family History  Arthritis Father, Mother. Cancer Mother. Heart Disease Father. Heart disease in female family member before age 77 Hypertension Father, Mother.  Pregnancy / Birth History  Age at menarche 31 years. Age of menopause <44 Contraceptive History Oral contraceptives. Gravida 0 Para 0    Review of Systems  General Present- Appetite Loss. Not Present- Chills, Fatigue, Fever, Night Sweats, Weight Gain and Weight Loss. Skin Not Present- Change in Wart/Mole, Dryness, Hives, Jaundice, New Lesions, Non-Healing Wounds, Rash and Ulcer. HEENT Not Present- Earache, Hearing Loss, Hoarseness, Nose Bleed, Oral Ulcers, Ringing in the Ears, Seasonal Allergies, Sinus Pain, Sore Throat, Visual Disturbances, Wears glasses/contact lenses and Yellow Eyes. Respiratory Not Present- Bloody sputum, Chronic Cough, Difficulty Breathing, Snoring and Wheezing. Breast Not Present- Breast Mass, Breast Pain, Nipple Discharge and Skin Changes. Cardiovascular Not Present- Chest Pain, Difficulty Breathing Lying Down, Leg Cramps, Palpitations, Rapid Heart Rate, Shortness of Breath and Swelling of Extremities. Gastrointestinal Present- Abdominal Pain, Bloating, Chronic diarrhea and Constipation. Not Present- Bloody Stool, Change in Bowel Habits, Difficulty Swallowing, Excessive gas, Gets full quickly at meals, Hemorrhoids, Indigestion, Nausea, Rectal Pain and Vomiting. Female Genitourinary Not Present-  Frequency, Nocturia, Painful Urination, Pelvic Pain and Urgency. Musculoskeletal Not Present- Back Pain, Joint Pain, Joint Stiffness, Muscle Pain, Muscle Weakness and Swelling of Extremities. Neurological Not Present- Decreased Memory, Fainting, Headaches, Numbness, Seizures, Tingling, Tremor, Trouble walking and  Weakness. Psychiatric Not Present- Anxiety, Bipolar, Change in Sleep Pattern, Depression, Fearful and Frequent crying. Endocrine Not Present- Cold Intolerance, Excessive Hunger, Hair Changes, Heat Intolerance, Hot flashes and New Diabetes. Hematology Not Present- Easy Bruising, Excessive bleeding, Gland problems, HIV and Persistent Infections.   BP 139/76   Pulse 66   Temp 97.6 F (36.4 C) (Oral)   Resp 16   Ht 5\' 2"  (1.575 m)   Wt 57.7 kg (127 lb 3 oz)   SpO2 100%   BMI 23.26 kg/m     Physical Exam  The physical exam findings are as follows: Note:General: Alert, trim Caucasian female, in no distress Skin: Warm and dry without rash or infection. HEENT: No palpable masses or thyromegaly. Sclera nonicteric. Pupils equal round and reactive. Oropharynx clear. Lymph nodes: No cervical, supraclavicular, or inguinal nodes palpable. Lungs: Breath sounds clear and equal. No wheezing or increased work of breathing. Cardiovascular: Regular rate and rhythm without murmer. No JVD or edema. Peripheral pulses intact. No carotid bruits. Abdomen: Nondistended. Mild left lower quadrant tenderness without guarding. No masses palpable. No organomegaly. No palpable hernias. Extremities: No edema or joint swelling or deformity. No chronic venous stasis changes. Neurologic: Alert and fully oriented. Gait normal. No focal weakness. Psychiatric: Normal mood and affect. Thought content appropriate with normal judgement and insight  I personally reviewed CT scans from 2015 and from last month. She has focal significant diverticulitis in her distal left colon with 2015 showing some pericolonic  fluid and air dots and currently showing just thickening and stranding in the same area.    Assessment & Plan Marland Kitchen T. Ia Leeb MD; 08/17/2015 4:06 PM) DIVERTICULITIS LARGE INTESTINE W/O PERFORATION OR ABSCESS W/O BLEEDING (K57.32) Impression: Recurrent diverticulitis of the left colon. This is only her second episode but she now has been feeling poorly for about 6 weeks and feels like she is not really getting over this episode. No evidence of complication.  I do long discussion with her regarding options and pros and cons of surgical treatment. I told her she certainly is not forced to have surgery at this point and that if she would prefer continued medical management to see how she responds this is perfectly reasonable. However it is of some concern that she is not really responding completely to treatment since last month. We discussed surgery in detail including its nature and expected results as well as risks of anesthetic complications, bleeding, infection or anastomotic leak and possible need for ostomy and rare chance of death. After our discussion she feels strongly that this is dragging on too long and she feels poorly enough and also concerned about future complications to the point that she wants to proceed with colectomy. I think this is reasonable. Current Plans Pt Education - Pamphlet Given - Laparoscopic Colorectal Surgery: discussed with patient and provided information. Pt Education - Pamphlet Given - Diverticulosis and Diverticulitis: discussed with patient and provided information. Laparoscopic partial colectomy

## 2015-10-08 NOTE — Interval H&P Note (Signed)
History and Physical Interval Note:  10/08/2015 7:23 AM  Elizabeth Li  has presented today for surgery, with the diagnosis of diverticulitis  The various methods of treatment have been discussed with the patient and family. After consideration of risks, benefits and other options for treatment, the patient has consented to  Procedure(s): LAPAROSCOPIC PARTIAL COLECTOMY (N/A) as a surgical intervention .  The patient's history has been reviewed, patient examined, no change in status, stable for surgery.  I have reviewed the patient's chart and labs.  Questions were answered to the patient's satisfaction.     [name]

## 2015-10-09 LAB — BASIC METABOLIC PANEL
Anion gap: 6 (ref 5–15)
CALCIUM: 8.5 mg/dL — AB (ref 8.9–10.3)
CO2: 26 mmol/L (ref 22–32)
Chloride: 102 mmol/L (ref 101–111)
Creatinine, Ser: 0.43 mg/dL — ABNORMAL LOW (ref 0.44–1.00)
GFR calc Af Amer: >60 mL/min (ref >60)
GLUCOSE: 132 mg/dL — AB (ref 65–99)
Potassium: 4.3 mmol/L (ref 3.5–5.1)
Sodium: 134 mmol/L — ABNORMAL LOW (ref 135–145)

## 2015-10-09 LAB — CBC
HCT: 34.9 % — ABNORMAL LOW (ref 36.0–46.0)
Hemoglobin: 12.2 g/dL (ref 12.0–15.0)
MCH: 30.4 pg (ref 26.0–34.0)
MCHC: 35 g/dL (ref 30.0–36.0)
MCV: 87 fL (ref 78.0–100.0)
Platelets: 279 10*3/uL (ref 150–400)
RBC: 4.01 MIL/uL (ref 3.87–5.11)
RDW: 12.7 % (ref 11.5–15.5)
WBC: 7.4 10*3/uL (ref 4.0–10.5)

## 2015-10-09 MED ORDER — BOOST / RESOURCE BREEZE PO LIQD
1.0000 | Freq: Two times a day (BID) | ORAL | Status: DC
  Administered 2015-10-09 – 2015-10-10 (×2): 1 via ORAL

## 2015-10-09 NOTE — Consult Note (Signed)
   [facility] CM Inpatient Consult   10/09/2015  JANYTH MARRESE [birth year] [id number]   Came to bedside to visit with Mrs. Elizabeth Li on behalf of Link to [facility] Care Management program for [employer] employees/dependents with [facility] insurance. Discussed Link to Wellness program and packet was provided. She denies having any Link to Wellness needs at this time. Confirmed best contact number as her home number at [phone]. Cell number as an alternate number at [phone]. Made aware that she will receive post hospital discharge call. Appreciative of visit.   Marthenia Rolling, MSN-Ed, RN,BSN [facility] Liaison [phone]

## 2015-10-09 NOTE — Progress Notes (Signed)
Initial Nutrition Assessment  DOCUMENTATION CODES:   Not applicable  INTERVENTION:  - Diet advancement as medically feasible.  - Will order Boost Breeze BID, each supplement provides 250 kcal and 9 grams of protein - RD will continue to monitor for needs, including need for diet education prior to d/c.  NUTRITION DIAGNOSIS:   Inadequate oral intake related to inability to eat as evidenced by NPO status.  GOAL:   Patient will meet greater than or equal to 90% of their needs  MONITOR:   Diet advancement, Weight trends, Labs, Skin, I & O's  REASON FOR ASSESSMENT:   Malnutrition Screening Tool  ASSESSMENT:   60 year old female who presents with diverticulitis. She is a very pleasant 60 year old female patient of Dr. Wende Neighbors and Dr. Gala Romney who presents with persistent/recurrent diverticulitis.  She was in her usual state of health until 2015 when she had an initial episode of diverticulitis confirmed with CT scan.  This was treated with outpatient antibiotics and gradually resolved.  She has never required hospitalization.  She did pretty well until April 2017.  She states she would get some occasional left lower quadrant pain but it was brief and not severe and did not require medical attention.  She does have history of long-standing chronic constipation and occasional intermittent diarrhea eventually consistent with irritable bowel syndrome.  In April of this year she again developed acute left lower quadrant pain.  CT scan was obtained July 09, 2015 which showed diverticulosis involving the descending and sigmoid colon with a focal inflammatory process in the left lower quadrant at the junction of the distal left colon and sigmoid with thickening of the colonic wall and pericolonic stranding.  No evidence of perforation.  She was treated with a course of oral antibiotics and improved but felt like she never really got over the episode.  She remained somewhat uncomfortable and tender  with some intermittent diarrhea.  She developed recurrent more significant pain yesterday.  Pt seen for MST. BMI indicates normal weight. Pt has been NPO since admission, but Surgery note from this AM states plan to advance to CLD today. Will order Boost Breeze in preparation for diet advancement. Pt was on the phone at time of attempted visit; will talk with pt about PO intakes and any exacerbating factors during follow-up visit. Will also complete physical assessment at that time.  Notes indicate pt with persistent/recurrent diverticulitis PTA. POD #1 for laparoscopic partial colectomy 7/24. Will monitor for need for diet education related to dx prior to /dc. Per chart review, pt has lost 3 lbs (2.3% body weight) in the past 2 months which is not significant for time frame.   Medications reviewed. Labs reviewed; Na: 134 mmol/L, BUN <5 mg/dL, creatinine: 0.43 mg/L, Ca: 8.5 mg/dL. IVF: D5-NS-20 mEq KCl @ 100 mL/hr (408 kcal).   Diet Order:  Diet NPO time specified Except for: Ice Chips, Sips with Meds  Skin:  Wound (see comment) (Abdominal incision from 7/24)  Last BM:  7/23  Height:   Ht Readings from Last 1 Encounters:  10/08/15 5\' 2"  (1.575 m)    Weight:   Wt Readings from Last 1 Encounters:  10/08/15 127 lb 3 oz (57.7 kg)    Ideal Body Weight:  50 kg (kg)  BMI:  Body mass index is 23.26 kg/m.  Estimated Nutritional Needs:   Kcal:  1400-1600  Protein:  60-70 grams  Fluid:  1.8-2 L/day  EDUCATION NEEDS:   No education needs identified at  this time    Jarome Matin, MS, RD, LDN Inpatient Clinical Dietitian Pager # [phone] After hours/weekend pager # [phone]

## 2015-10-09 NOTE — Progress Notes (Signed)
Patient ID: ITA HOLTAN, female   DOB: Jun 18, 1955, 60 y.o.   MRN: [id number] 1 Day Post-Op  Subjective: Having some pain at her left lower quadrant incision particularly with movement but no generalized abdominal pain.  Some nausea last night that has resolved.  Objective: Vital signs in last 24 hours: Temp:  [97.5 F (36.4 C)-98.7 F (37.1 C)] 98.7 F (37.1 C) (07/25 0609) Pulse Rate:  [70-82] 71 (07/25 0609) Resp:  [10-16] 16 (07/24 1250) BP: (141-189)/(64-90) 141/64 (07/25 0609) SpO2:  [98 %-100 %] 99 % (07/25 0609) Last BM Date: 10/07/15  Intake/Output from previous day: 07/24 0701 - 07/25 0700 In: 4330 [I.V.:4330] Out: 2030 [Urine:1930; Blood:100] Intake/Output this shift: No intake/output data recorded.  General appearance: alert, cooperative and no distress GI: mild appropriate tenderness around her left lower quadrant incision. Bowel sounds present. Nondistended. Incision/Wound: dressings clean and dry  Lab Results:   Recent Labs  10/09/15 0447  WBC 7.4  HGB 12.2  HCT 34.9*  PLT 279   BMET  Recent Labs  10/09/15 0447  NA 134*  K 4.3  CL 102  CO2 26  GLUCOSE 132*  BUN <5*  CREATININE 0.43*  CALCIUM 8.5*     Studies/Results: No results found.  Anti-infectives: Anti-infectives    Start     Dose/Rate Route Frequency Ordered Stop   10/08/15 0546  cefoTEtan (CEFOTAN) 2 g in dextrose 5 % 50 mL IVPB     2 g 100 mL/hr over 30 Minutes Intravenous On call to O.R. 10/08/15 0546 10/08/15 0746      Assessment/Plan: s/p Procedure(s): LAPAROSCOPIC PARTIAL COLECTOMY Doing well postoperatively without apparent complication. Start clear liquid diet. Ambulation encouraged.   LOS: 1 day    Yetunde Leis T 10/09/2015

## 2015-10-10 NOTE — Progress Notes (Signed)
Patient ID: Elizabeth Li, female   DOB: 09/30/1955, 60 y.o.   MRN: [id number] 2 Days Post-Op  Subjective: Pain at LLQ incision with activity, none at rest.  Tol CL well, + flatus, no BM  Objective: Vital signs in last 24 hours: Temp:  [98 F (36.7 C)-98.7 F (37.1 C)] 98 F (36.7 C) (07/26 0635) Pulse Rate:  [63-64] 64 (07/26 0635) Resp:  [16-18] 16 (07/26 0635) BP: (144-168)/(64-80) 160/80 (07/26 0635) SpO2:  [95 %-99 %] 99 % (07/26 0635) Last BM Date: 10/07/15  Intake/Output from previous day: 07/25 0701 - 07/26 0700 In: 1626.7 [I.V.:1626.7] Out: 475 [Urine:475] Intake/Output this shift: No intake/output data recorded.  General appearance: alert, cooperative and no distress GI: normal findings: soft, non-tender Incision/Wound: No erythema or drainage  Lab Results:   Recent Labs  10/09/15 0447  WBC 7.4  HGB 12.2  HCT 34.9*  PLT 279   BMET  Recent Labs  10/09/15 0447  NA 134*  K 4.3  CL 102  CO2 26  GLUCOSE 132*  BUN <5*  CREATININE 0.43*  CALCIUM 8.5*     Studies/Results: No results found.  Anti-infectives: Anti-infectives    Start     Dose/Rate Route Frequency Ordered Stop   10/08/15 0546  cefoTEtan (CEFOTAN) 2 g in dextrose 5 % 50 mL IVPB     2 g 100 mL/hr over 30 Minutes Intravenous On call to O.R. 10/08/15 0546 10/08/15 0746      Assessment/Plan: s/p Procedure(s): LAPAROSCOPIC PARTIAL COLECTOMY Doing well without apparent complication Advance to [place] diet   LOS: 2 days    Shaunta Oncale T 10/10/2015

## 2015-10-10 NOTE — Progress Notes (Signed)
Pt. Ambulated with RN this early AM with no oxygen and no assistive devices. Pt. Tolerated ambulation well and had no c/o SOB. Returned to bed and pain medicine was administered. Will continue to monitor.

## 2015-10-11 ENCOUNTER — Other Ambulatory Visit: Payer: Self-pay | Admitting: *Deleted

## 2015-10-11 DIAGNOSIS — K5732 Diverticulitis of large intestine without perforation or abscess without bleeding: Secondary | ICD-10-CM

## 2015-10-11 MED ORDER — OXYCODONE-ACETAMINOPHEN 5-325 MG PO TABS: 1.0000 | ORAL_TABLET | ORAL | 0 refills | Status: DC | PRN

## 2015-10-11 NOTE — Discharge Summary (Signed)
   Patient ID: Elizabeth Li [id number] 60 y.o. 1955/10/15  10/08/2015  Discharge date and time: 10/11/2015   Admitting Physician: Excell Seltzer T  Discharge Physician: Excell Seltzer T  Admission Diagnoses: diverticulitis  Discharge Diagnoses: Same  Operations: Procedure(s): LAPAROSCOPIC Sigmoid COLECTOMY  Admission Condition: fair  Discharged Condition: good  Indication for Admission: Patient is a 60 year old female with a documented episode of sigmoid diverticulitis by CT scan in spring of this year. She has continued to have frequent recurrent symptoms whenever she stops antibiotics over a number of months with  Persistent left lower quadrant abdominal pain and malaise. After extensive preoperative discussion and workup detailed elsewhere she is electively admitted for laparoscopic sigmoid colectomy.  Hospital Course: Patient underwent an uneventful laparoscopic sigmoid colectomy on the morning of admission with findings of significant diverticulitis of the proximal sigmoid colon.  Her postoperative course was unremarkable.  She was started on clear liquids on the first postoperative day with just some  Manageable incisional pain. Her diet was able to be gradually advanced over the next 2 days. She began to have bowel movements. White blood count and hemoglobin were normal. Abdomen remained benign  With clean incisions. Final pathology confirmed sigmoid diverticulitis.    Disposition: Home  Patient Instructions:    Medication List    STOP taking these medications   amoxicillin-clavulanate 875-125 MG tablet Commonly known as:  AUGMENTIN     TAKE these medications   CALCIUM PO Take 2 tablets by mouth daily.   FISH OIL PO Take 2 capsules by mouth daily.   FLUoxetine 20 MG capsule Commonly known as:  PROZAC Take 20 mg by mouth daily.   HYDROcodone-acetaminophen 5-325 MG tablet Commonly known as:  NORCO Take 1 tablet by mouth every 6 (six) hours as needed  for moderate pain.   linaclotide 72 MCG capsule Commonly known as:  LINZESS Take 1 capsule (72 mcg total) by mouth daily before breakfast.   losartan 100 MG tablet Commonly known as:  COZAAR Take 100 mg by mouth daily.   multivitamin with minerals Tabs tablet Take 1 tablet by mouth daily.   oxyCODONE-acetaminophen 5-325 MG tablet Commonly known as:  PERCOCET/ROXICET Take 1-2 tablets by mouth every 4 (four) hours as needed for moderate pain.   simvastatin 40 MG tablet Commonly known as:  ZOCOR Take 40 mg by mouth daily.   VITAMIN D (CHOLECALCIFEROL) PO Take 1 tablet by mouth daily.       Activity: no heavy lifting for 3 weeks Diet: regular diet Wound Care: none needed  Follow-up:  With Dr. Excell Seltzer in 3 weeks.  Signed: Edward Jolly MD, FACS  10/11/2015, 7:27 AM

## 2015-10-11 NOTE — Discharge Instructions (Signed)
[facility]      [facility] [phone]  OPEN ABDOMINAL SURGERY: POST OP INSTRUCTIONS  Always review your discharge instruction sheet given to you by the facility where your surgery was performed.  IF YOU HAVE DISABILITY OR FAMILY LEAVE FORMS, YOU MUST BRING THEM TO THE OFFICE FOR PROCESSING.  PLEASE DO NOT GIVE THEM TO YOUR DOCTOR.  1. A prescription for pain medication may be given to you upon discharge.  Take your pain medication as prescribed, if needed.  If narcotic pain medicine is not needed, then you may take acetaminophen (Tylenol) or ibuprofen (Advil) as needed. 2. Take your usually prescribed medications unless otherwise directed. 3. If you need a refill on your pain medication, please contact your pharmacy. They will contact our office to request authorization.  Prescriptions will not be filled after 5pm or on week-ends. 4. You should follow a light diet the first few days after arrival home, such as soup and crackers, pudding, etc.unless your doctor has advised otherwise. A high-fiber, low fat diet can be resumed as tolerated.   Be sure to include lots of fluids daily. Most patients will experience some swelling and bruising on the chest and neck area.  Ice packs will help.  Swelling and bruising can take several days to resolve 5. Most patients will experience some swelling and bruising in the area of the incision. Ice pack will help. Swelling and bruising can take several days to resolve..  6. It is common to experience some constipation if taking pain medication after surgery.  Increasing fluid intake and taking a stool softener will usually help or prevent this problem from occurring.  A mild laxative (Milk of Magnesia or Miralax) should be taken according to package directions if there are no bowel movements after 48 hours. 7.  You may have steri-strips (small skin tapes) in place directly over the incision.  These strips should be left on the skin for 7-10 days.  If your  surgeon used skin glue on the incision, you may shower in 24 hours.  The glue will flake off over the next 2-3 weeks.  Any sutures or staples will be removed at the office during your follow-up visit. You may find that a light gauze bandage over your incision may keep your staples from being rubbed or pulled. You may shower and replace the bandage daily. 8. ACTIVITIES:  You may resume regular (light) daily activities beginning the next day--such as daily self-care, walking, climbing stairs--gradually increasing activities as tolerated.  You may have sexual intercourse when it is comfortable.  Refrain from any heavy lifting or straining until approved by your doctor. a. You may drive when you no longer are taking prescription pain medication, you can comfortably wear a seatbelt, and you can safely maneuver your car and apply brakes b. Return to Work: ___________________________________ 9. You should see your doctor in the office for a follow-up appointment approximately two weeks after your surgery.  Make sure that you call for this appointment within a day or two after you arrive home to insure a convenient appointment time. OTHER INSTRUCTIONS:  _____________________________________________________________ _____________________________________________________________  WHEN TO CALL YOUR DOCTOR: 1. Fever over 101.0 2. Inability to urinate 3. Nausea and/or vomiting 4. Extreme swelling or bruising 5. Continued bleeding from incision. 6. Increased pain, redness, or drainage from the incision. 7. Difficulty swallowing or breathing 8. Muscle cramping or spasms. 9. Numbness or tingling in hands or feet or around lips.  The clinic staff is available to   answer your questions during regular business hours.  Please don't hesitate to call and ask to speak to one of the nurses if you have concerns.  For further questions, please visit [email or web address]   

## 2015-10-11 NOTE — Progress Notes (Signed)
Pharmacy Brief Note - Alvimopan (Entereg)  The standing order set for alvimopan (Entereg) now includes an automatic order to discontinue the drug after the patient has had a bowel movement. The change was approved by the [place] and the Medical Executive Committee.   This patient has had bowel movements documented by nursing. Therefore, alvimopan has been discontinued. If there are questions, please contact the pharmacy at [phone].   Thank you-  Dolly Rias RPh 10/11/2015, 8:15 AM Pager [phone]

## 2015-10-11 NOTE — Progress Notes (Signed)
Patient ID: Elizabeth Li, female   DOB: Jan 14, 1956, 60 y.o.   MRN: [id number] 3 Days Post-Op  Subjective: Doing well today. Had a bowel movement. Tolerating diet without nausea.  Steadily less pain at her left lower quadrant incision.  Objective: Vital signs in last 24 hours: Temp:  [98.2 F (36.8 C)-99.1 F (37.3 C)] 99.1 F (37.3 C) (07/27 0544) Pulse Rate:  [60-64] 61 (07/27 0544) Resp:  [18] 18 (07/27 0544) BP: (161-168)/(64-74) 168/74 (07/27 0544) SpO2:  [95 %-98 %] 95 % (07/27 0544) Last BM Date: 10/10/15  Intake/Output from previous day: 07/26 0701 - 07/27 0700 In: 2040 [P.O.:360; I.V.:1680] Out: -  Intake/Output this shift: No intake/output data recorded.  General appearance: alert, cooperative and no distress GI: normal findings: soft, non-tender Incision/Wound: cclean and dry  Lab Results:   Recent Labs  10/09/15 0447  WBC 7.4  HGB 12.2  HCT 34.9*  PLT 279   BMET  Recent Labs  10/09/15 0447  NA 134*  K 4.3  CL 102  CO2 26  GLUCOSE 132*  BUN <5*  CREATININE 0.43*  CALCIUM 8.5*     Studies/Results: No results found.  Anti-infectives: Anti-infectives    Start     Dose/Rate Route Frequency Ordered Stop   10/08/15 0546  cefoTEtan (CEFOTAN) 2 g in dextrose 5 % 50 mL IVPB     2 g 100 mL/hr over 30 Minutes Intravenous On call to O.R. 10/08/15 0546 10/08/15 0746      Assessment/Plan: s/p Procedure(s): LAPAROSCOPIC PARTIAL COLECTOMY Doing very well postoperatively without apparent complication. Ready for discharge.`   LOS: 3 days    Sandi Towe T 10/11/2015

## 2015-10-12 ENCOUNTER — Encounter: Payer: Self-pay | Admitting: *Deleted

## 2015-10-12 NOTE — Progress Notes (Signed)
This encounter was created in error - please disregard.  This encounter was created in error - please disregard.

## 2015-10-12 NOTE — Patient Outreach (Signed)
[facility]) Care Management  10/12/2015  Elizabeth Li 08/12/1955 [id number]   Subjective: Telephone call to patient's home number, spoke with patient, and HIPAA verified.   States she is doing well and very appreciative of the follow up call.  Discussed [facility] Care Management [facility] Transition of care follow up, patient voices understanding, and is in agreement to complete follow up. Patient states she has an appointment with primary MD in October, and will call if sooner appointment needed.   States she has a follow up appointment with surgeon on 11/11/15.    Patient states she has no further transition of care follow up needs, care management, disease management, disease monitoring, community resource, pharmacy, or transportation needs at this time.  States she has received [facility] [facility] information packet from [facility] and does not a successful outreach letter sent out.    Objective: Per chart review: Patient hospitalized 10/08/15 - 10/11/15 for diverticulitis.    Assessment:  Received [facility] Transition of care referral on 10/11/15.   Transition of care follow up completed, no care management needs, and will proceed with case closure.  Plan: RNCM will send case closure due to follow up completed / no care management needs request to [facility].  Genevia Bouldin H. Annia Friendly, BSN, [facility] Telephonic CM Phone: [phone] Fax: [phone]

## 2015-11-15 DIAGNOSIS — D225 Melanocytic nevi of trunk: Secondary | ICD-10-CM | POA: Diagnosis not present

## 2015-11-15 DIAGNOSIS — D485 Neoplasm of uncertain behavior of skin: Secondary | ICD-10-CM | POA: Diagnosis not present

## 2015-11-15 DIAGNOSIS — L821 Other seborrheic keratosis: Secondary | ICD-10-CM | POA: Diagnosis not present

## 2015-11-15 DIAGNOSIS — Z1283 Encounter for screening for malignant neoplasm of skin: Secondary | ICD-10-CM | POA: Diagnosis not present

## 2015-11-20 DIAGNOSIS — H5213 Myopia, bilateral: Secondary | ICD-10-CM | POA: Diagnosis not present

## 2015-11-20 DIAGNOSIS — H524 Presbyopia: Secondary | ICD-10-CM | POA: Diagnosis not present

## 2015-12-13 MED FILL — SIMVASTATIN 80 MG TABLET: 80 | 90 days supply | Qty: 45 | Fill #2

## 2015-12-13 MED FILL — LOSARTAN POTASSIUM 100 MG T: 100 | 90 days supply | Qty: 90 | Fill #1

## 2015-12-13 MED FILL — FLUoxetine HCL 20 MG CAPS: 20 | 90 days supply | Qty: 90 | Fill #1

## 2015-12-25 DIAGNOSIS — I10 Essential (primary) hypertension: Secondary | ICD-10-CM | POA: Diagnosis not present

## 2015-12-28 DIAGNOSIS — Z9049 Acquired absence of other specified parts of digestive tract: Secondary | ICD-10-CM | POA: Diagnosis not present

## 2015-12-28 DIAGNOSIS — Z6822 Body mass index (BMI) 22.0-22.9, adult: Secondary | ICD-10-CM | POA: Diagnosis not present

## 2015-12-28 DIAGNOSIS — E782 Mixed hyperlipidemia: Secondary | ICD-10-CM | POA: Diagnosis not present

## 2015-12-28 DIAGNOSIS — F339 Major depressive disorder, recurrent, unspecified: Secondary | ICD-10-CM | POA: Diagnosis not present

## 2015-12-28 DIAGNOSIS — I10 Essential (primary) hypertension: Secondary | ICD-10-CM | POA: Diagnosis not present

## 2015-12-28 DIAGNOSIS — F411 Generalized anxiety disorder: Secondary | ICD-10-CM | POA: Diagnosis not present

## 2015-12-31 ENCOUNTER — Telehealth: Payer: Self-pay | Admitting: Internal Medicine

## 2015-12-31 NOTE — Telephone Encounter (Signed)
Pt called to say that she is changing insurance and wanted to know is there anything else similar to Linzess or something OTC. She is still taking Linzess for now, but in January it may change due to insurance. Please advise and her home number is [phone]

## 2016-01-01 MED FILL — LINZESS 72 MCG CAPSULE: 72 | 90 days supply | Qty: 90 | Fill #2

## 2016-01-02 NOTE — Telephone Encounter (Signed)
Routing to AB.  

## 2016-01-03 NOTE — Telephone Encounter (Signed)
I'm not sure if Amitiza would be covered same as Linzess. She could let us know what her insurance changes to at the beginning of the year, and we can go from there. Could always trial Miralax one capful daily over the counter prn.   Annitta Needs, ANP-BC [facility]

## 2016-01-03 NOTE — Telephone Encounter (Signed)
Sent pt a message via my chart :)

## 2016-01-22 DIAGNOSIS — R1032 Left lower quadrant pain: Secondary | ICD-10-CM | POA: Diagnosis not present

## 2016-01-22 DIAGNOSIS — Z6822 Body mass index (BMI) 22.0-22.9, adult: Secondary | ICD-10-CM | POA: Diagnosis not present

## 2016-02-04 ENCOUNTER — Ambulatory Visit (HOSPITAL_COMMUNITY)
Admission: RE | Admit: 2016-02-04 | Discharge: 2016-02-04 | Disposition: A | Discharge status: Home / Self Care | Payer: 59 | Source: Ambulatory Visit | Attending: Internal Medicine | Admitting: Internal Medicine

## 2016-02-04 ENCOUNTER — Other Ambulatory Visit (HOSPITAL_COMMUNITY): Payer: Self-pay | Admitting: Internal Medicine

## 2016-02-04 DIAGNOSIS — R1030 Lower abdominal pain, unspecified: Secondary | ICD-10-CM | POA: Diagnosis not present

## 2016-02-04 DIAGNOSIS — M25559 Pain in unspecified hip: Secondary | ICD-10-CM

## 2016-02-04 DIAGNOSIS — R1032 Left lower quadrant pain: Secondary | ICD-10-CM | POA: Diagnosis not present

## 2016-02-11 DIAGNOSIS — M1612 Unilateral primary osteoarthritis, left hip: Secondary | ICD-10-CM | POA: Diagnosis not present

## 2016-02-15 ENCOUNTER — Other Ambulatory Visit (HOSPITAL_COMMUNITY): Payer: Self-pay | Admitting: Internal Medicine

## 2016-02-15 DIAGNOSIS — Z1231 Encounter for screening mammogram for malignant neoplasm of breast: Secondary | ICD-10-CM

## 2016-02-28 DIAGNOSIS — M1612 Unilateral primary osteoarthritis, left hip: Secondary | ICD-10-CM | POA: Diagnosis not present

## 2016-03-05 ENCOUNTER — Ambulatory Visit (HOSPITAL_COMMUNITY)
Admission: RE | Admit: 2016-03-05 | Discharge: 2016-03-05 | Disposition: A | Discharge status: Home / Self Care | Payer: 59 | Source: Ambulatory Visit | Attending: Internal Medicine | Admitting: Internal Medicine

## 2016-03-05 DIAGNOSIS — Z1231 Encounter for screening mammogram for malignant neoplasm of breast: Secondary | ICD-10-CM | POA: Insufficient documentation

## 2016-03-05 DIAGNOSIS — Z6824 Body mass index (BMI) 24.0-24.9, adult: Secondary | ICD-10-CM | POA: Diagnosis not present

## 2016-03-05 DIAGNOSIS — Z01419 Encounter for gynecological examination (general) (routine) without abnormal findings: Secondary | ICD-10-CM | POA: Diagnosis not present

## 2016-03-07 MED FILL — FLUoxetine HCL 20 MG CAPS: 20 | 90 days supply | Qty: 90 | Fill #2

## 2016-03-07 MED FILL — SIMVASTATIN 80 MG TABLET: 80 | 90 days supply | Qty: 45 | Fill #3

## 2016-03-07 MED FILL — LOSARTAN POTASSIUM 100 MG T: 100 | 90 days supply | Qty: 90 | Fill #2

## 2016-03-26 DIAGNOSIS — M25552 Pain in left hip: Secondary | ICD-10-CM | POA: Diagnosis not present

## 2016-03-26 DIAGNOSIS — M1612 Unilateral primary osteoarthritis, left hip: Secondary | ICD-10-CM | POA: Diagnosis not present

## 2016-04-22 ENCOUNTER — Telehealth: Payer: Self-pay

## 2016-04-22 MED ORDER — POLYETHYLENE GLYCOL 3350 17 GM/SCOOP PO POWD: ORAL | 3 refills | Status: DC

## 2016-04-22 NOTE — Telephone Encounter (Signed)
Pt called and said the linzess is going to cost her $900 for a 3 month supply. She feels like it is too strong for her anyway and wants to know if she can just take miralax daily and if that is ok can we try to send in an rx to see if insurance will cover it?

## 2016-04-22 NOTE — Telephone Encounter (Signed)
Sent in Miralax!

## 2016-04-29 MED ORDER — POLYETHYLENE GLYCOL 3350 17 GM/SCOOP PO POWD: ORAL | 3 refills | Status: DC

## 2016-04-29 NOTE — Telephone Encounter (Signed)
Spoke with the pt, she has not heard about her miralax. I checked and rx was sent to the wrong pharmacy. I have resent it to the [facility] outpatient pharmacy. Vicente Males is aware.

## 2016-05-28 DIAGNOSIS — H0016 Chalazion left eye, unspecified eyelid: Secondary | ICD-10-CM | POA: Diagnosis not present

## 2016-05-28 DIAGNOSIS — Z6821 Body mass index (BMI) 21.0-21.9, adult: Secondary | ICD-10-CM | POA: Diagnosis not present

## 2016-05-30 MED FILL — LOSARTAN POTASSIUM 100 MG T: 100 | 90 days supply | Qty: 90 | Fill #3

## 2016-05-30 MED FILL — SIMVASTATIN 80 MG TABLET: 80 | 90 days supply | Qty: 45 | Fill #0

## 2016-05-30 MED FILL — FLUoxetine HCL 20 MG CAPS: 20 | 90 days supply | Qty: 90 | Fill #3

## 2016-06-03 DIAGNOSIS — Z1159 Encounter for screening for other viral diseases: Secondary | ICD-10-CM | POA: Diagnosis not present

## 2016-06-03 DIAGNOSIS — I10 Essential (primary) hypertension: Secondary | ICD-10-CM | POA: Diagnosis not present

## 2016-06-05 DIAGNOSIS — I10 Essential (primary) hypertension: Secondary | ICD-10-CM | POA: Diagnosis not present

## 2016-06-05 DIAGNOSIS — R079 Chest pain, unspecified: Secondary | ICD-10-CM | POA: Diagnosis not present

## 2016-06-05 DIAGNOSIS — E782 Mixed hyperlipidemia: Secondary | ICD-10-CM | POA: Diagnosis not present

## 2016-06-05 DIAGNOSIS — Z6823 Body mass index (BMI) 23.0-23.9, adult: Secondary | ICD-10-CM | POA: Diagnosis not present

## 2016-06-06 ENCOUNTER — Ambulatory Visit (HOSPITAL_COMMUNITY)
Admission: EM | Admit: 2016-06-06 | Discharge: 2016-06-06 | Disposition: A | Discharge status: Home / Self Care | Payer: 59 | Attending: Internal Medicine | Admitting: Internal Medicine

## 2016-06-06 ENCOUNTER — Encounter (HOSPITAL_COMMUNITY): Payer: Self-pay | Admitting: Emergency Medicine

## 2016-06-06 DIAGNOSIS — M76892 Other specified enthesopathies of left lower limb, excluding foot: Secondary | ICD-10-CM

## 2016-06-06 DIAGNOSIS — M7050 Other bursitis of knee, unspecified knee: Secondary | ICD-10-CM

## 2016-06-06 NOTE — ED Provider Notes (Signed)
CSN: [id number]     Arrival date & time 06/06/16  1020 History   First MD Initiated Contact with Patient 06/06/16 1130     Chief Complaint  Patient presents with  . Knee Pain   (Consider location/radiation/quality/duration/timing/severity/associated sxs/prior Treatment) 61 year old female who states that she tries to stay in good shape and she walks 2.5 miles several times a week. In the past few days she has noticed some pain in the medial aspect of the left knee with some swelling. Occasionally she will hear some popping type noise. No known trauma. She did not twist her knee no blunt trauma no fall.      Past Medical History:  Diagnosis Date  . Arthritis   . Diverticulitis   . History of frequent urinary tract infections   . Hypercholesterolemia   . Hypertension   . Wears glasses    Past Surgical History:  Procedure Laterality Date  . COLONOSCOPY  07/15/2008   polyps-done in [place]  . COLONOSCOPY N/A 11/24/2014   Dr. Gala Romney: colonic diverticulosis, melanosis coli, surveillance in 2021  . FOOT SURGERY    . LAPAROSCOPIC PARTIAL COLECTOMY N/A 10/08/2015   Procedure: LAPAROSCOPIC PARTIAL COLECTOMY;  Surgeon: Excell Seltzer, MD;  Location: [facility];  Service: General;  Laterality: N/A;  . TONSILLECTOMY    . TRIGGER FINGER RELEASE Right 02/27/2015   Procedure: RELEASE TRIGGER FINGER/A-1 PULLEY RIGHT LONG FINGER;  Surgeon: Leanora Cover, MD;  Location: [place];  Service: Orthopedics;  Laterality: Right;   Family History  Problem Relation Age of Onset  . Colon cancer Neg Hx    Social History  Substance Use Topics  . Smoking status: Never Smoker  . Smokeless tobacco: Never Used  . Alcohol use 1.8 oz/week    3 Glasses of wine per week     Comment: 3-4 per week   OB History    No data available     Review of Systems  Constitutional: Negative for activity change, chills and fever.  HENT: Negative.   Respiratory: Negative.   Cardiovascular: Negative.    Musculoskeletal:       As per HPI  Skin: Negative for color change, pallor and rash.  Neurological: Negative.   All other systems reviewed and are negative.   Allergies  Flagyl [metronidazole] and Benadryl [diphenhydramine hcl]  Home Medications   Prior to Admission medications   Medication Sig Start Date End Date Taking? Authorizing Provider  CALCIUM PO Take 2 tablets by mouth daily.     Historical Provider, MD  FLUoxetine (PROZAC) 20 MG capsule Take 20 mg by mouth daily.    Historical Provider, MD  HYDROcodone-acetaminophen (NORCO) 5-325 MG tablet Take 1 tablet by mouth every 6 (six) hours as needed for moderate pain. Patient not taking: Reported on 09/27/2015 08/17/15   Annitta Needs, NP  linaclotide [facility]) 72 MCG capsule Take 1 capsule (72 mcg total) by mouth daily before breakfast. 07/17/15   Annitta Needs, NP  losartan (COZAAR) 100 MG tablet Take 100 mg by mouth daily.    Historical Provider, MD  Multiple Vitamin (MULTIVITAMIN WITH MINERALS) TABS tablet Take 1 tablet by mouth daily.    Historical Provider, MD  Omega-3 Fatty Acids (FISH OIL PO) Take 2 capsules by mouth daily.    Historical Provider, MD  oxyCODONE-acetaminophen (PERCOCET/ROXICET) 5-325 MG tablet Take 1-2 tablets by mouth every 4 (four) hours as needed for moderate pain. 10/11/15   Excell Seltzer, MD  polyethylene glycol powder [facility]) powder Take  17 grams by mouth daily. 04/29/16   Annitta Needs, NP  simvastatin (ZOCOR) 40 MG tablet Take 40 mg by mouth daily.    Historical Provider, MD  VITAMIN D, CHOLECALCIFEROL, PO Take 1 tablet by mouth daily.    Historical Provider, MD   Meds Ordered and Administered this Visit  Medications - No data to display  BP (!) 149/83   Pulse 64   Temp 97.6 F (36.4 C) (Oral)   Resp 16   Ht 5\' 2"  (1.575 m)   Wt 130 lb (59 kg)   SpO2 96%   BMI 23.78 kg/m  No data found.   Physical Exam  Constitutional: She is oriented to person, place, and time. She appears  well-developed and well-nourished. No distress.  HENT:  Head: Normocephalic and atraumatic.  Eyes: EOM are normal. Pupils are equal, round, and reactive to light.  Neck: Normal range of motion. Neck supple.  Musculoskeletal:  Minor puffiness over the medial aspect of the knee. Tenderness over the medial muscles of the knee including the sartorius,, gracilis. No tenderness over the patella or patellar tendon. Negative drawer, negative varus, negative valgus, no laxity appreciated. No effusion. Full range of motion of the knee with extension and flexion. This to neurovascular motor sensory is intact.  Lymphadenopathy:    She has no cervical adenopathy.  Neurological: She is alert and oriented to person, place, and time. No cranial nerve deficit.  Skin: Skin is warm and dry.  Psychiatric: She has a normal mood and affect.  Nursing note and vitals reviewed.   Urgent Care Course     Procedures (including critical care time)  Labs Review Labs Reviewed - No data to display  Imaging Review No results found.   Visual Acuity Review  Right Eye Distance:   Left Eye Distance:   Bilateral Distance:    Right Eye Near:   Left Eye Near:    Bilateral Near:         MDM   1. Pes anserine bursitis   2. Tendinitis of left knee    Limit your walking and activities that cause pain for the next week or 2. Sometimes this is longer. Apply ice to the inside of your knee off and on 2-3 times a day. Take your diclofenac medication as directed and take it with food. Read your instructions that accompany these papers.     Janne Napoleon, NP 06/06/16 1150

## 2016-06-06 NOTE — Discharge Instructions (Signed)
Limit your walking and activities that cause pain for the next week or 2. Sometimes this is longer. Apply ice to the inside of your knee off and on 2-3 times a day. Take your diclofenac medication as directed and take it with food. Read your instructions that accompany these papers.

## 2016-07-14 MED FILL — DICLOFENAC SOD 75 MG TAB EC: 75 | 30 days supply | Qty: 60 | Fill #0

## 2016-08-29 MED FILL — SIMVASTATIN 80 MG TABLET: 80 | 90 days supply | Qty: 45 | Fill #1

## 2016-08-29 MED FILL — LOSARTAN POTASSIUM 100 MG T: 100 | 90 days supply | Qty: 90 | Fill #4

## 2016-09-01 MED FILL — FLUoxetine HCL 20 MG CAPS: 20 | 90 days supply | Qty: 90 | Fill #0

## 2016-10-06 ENCOUNTER — Telehealth: Payer: Self-pay

## 2016-10-06 NOTE — Telephone Encounter (Signed)
Sent to scheduling

## 2016-11-13 DIAGNOSIS — M25562 Pain in left knee: Secondary | ICD-10-CM | POA: Diagnosis not present

## 2016-11-13 DIAGNOSIS — M25561 Pain in right knee: Secondary | ICD-10-CM | POA: Diagnosis not present

## 2016-11-20 ENCOUNTER — Ambulatory Visit (INDEPENDENT_AMBULATORY_CARE_PROVIDER_SITE_OTHER): Payer: 59 | Admitting: Cardiology

## 2016-11-20 ENCOUNTER — Encounter: Payer: Self-pay | Admitting: Cardiology

## 2016-11-20 DIAGNOSIS — I10 Essential (primary) hypertension: Secondary | ICD-10-CM

## 2016-11-20 DIAGNOSIS — E785 Hyperlipidemia, unspecified: Secondary | ICD-10-CM

## 2016-11-20 DIAGNOSIS — E7800 Pure hypercholesterolemia, unspecified: Secondary | ICD-10-CM | POA: Diagnosis not present

## 2016-11-20 DIAGNOSIS — R079 Chest pain, unspecified: Secondary | ICD-10-CM

## 2016-11-20 HISTORY — DX: Essential (primary) hypertension: I10

## 2016-11-20 HISTORY — DX: Hyperlipidemia, unspecified: E78.5

## 2016-11-20 HISTORY — DX: Chest pain, unspecified: R07.9

## 2016-11-20 NOTE — Progress Notes (Signed)
Cardiology Office Note    Date:  11/20/2016   ID:  Elizabeth Li, DOB [birth year], MRN [id number]  PCP:  Celene Squibb, MD  Cardiologist:  Fransico Him, MD   Chief Complaint  Patient presents with  . Chest Pain    History of Present Illness:  Elizabeth Li is a 61 y.o. female who is being seen today for the evaluation of chest pain  at the request of Celene Squibb, MD.  She states that she occasionally has had some chest pain to the left of the sternum as long as 6 years ago but very mild and she would ignore it.  She moved here 6 years ago and has intermittently had some.  About 5 months ago she got up to go to work and had some chest discomfort that lasted a few minutes to the left side of her chest that felt like a pulse and was intermittent but no associated symptoms.  She called out of work and had the pain intermittent all day.  The next day at work she had an EKG done that was normal.  She say her PCP and EKG was fine and she is referred here today.  Her father died of CAD and had an MI at 58yo, her father's aunt died of an MI as well.  She denies any SOB with the pain, nausea or diaphoresis and no radiation of the pain.  She denies any DOE, PND, orthopnea, LE edema, dizziness or syncope.  Occasionally she will notice that her heart is racing if she has been working out in the yard for long periods of time in the extreme heat.      Past Medical History:  Diagnosis Date  . Arthritis   . Benign essential HTN 11/20/2016  . Chest pain 11/20/2016  . Diverticulitis   . History of frequent urinary tract infections   . Hypercholesterolemia   . Hyperlipidemia 11/20/2016  . Hypertension   . Wears glasses     Past Surgical History:  Procedure Laterality Date  . COLONOSCOPY  07/15/2008   polyps-done in [place]  . COLONOSCOPY N/A 11/24/2014   Dr. Gala Romney: colonic diverticulosis, melanosis coli, surveillance in 2021  . FOOT SURGERY    . LAPAROSCOPIC PARTIAL COLECTOMY N/A 10/08/2015   Procedure: LAPAROSCOPIC PARTIAL COLECTOMY;  Surgeon: Excell Seltzer, MD;  [facility]: [facility];  Service: General;  Laterality: N/A;  . TONSILLECTOMY    . TRIGGER FINGER RELEASE Right 02/27/2015   Procedure: RELEASE TRIGGER FINGER/A-1 PULLEY RIGHT LONG FINGER;  Surgeon: Leanora Cover, MD;  Location: [place];  Service: Orthopedics;  Laterality: Right;    Current Medications: Current Meds  Medication Sig  . aspirin EC 81 MG tablet Take 81 mg by mouth daily.  Marland Kitchen CALCIUM PO Take 2 tablets by mouth daily.   Marland Kitchen FLUoxetine (PROZAC) 20 MG capsule Take 20 mg by mouth daily.  Marland Kitchen losartan (COZAAR) 100 MG tablet Take 100 mg by mouth daily.  . Multiple Vitamin (MULTIVITAMIN WITH MINERALS) TABS tablet Take 1 tablet by mouth daily.  . Omega-3 Fatty Acids (FISH OIL PO) Take 2 capsules by mouth daily.  . polyethylene glycol powder (GLYCOLAX/MIRALAX) powder Take 17 grams by mouth daily.  . simvastatin (ZOCOR) 40 MG tablet Take 40 mg by mouth daily.  Marland Kitchen VITAMIN D, CHOLECALCIFEROL, PO Take 1 tablet by mouth daily.  . [DISCONTINUED] linaclotide (LINZESS) 72 MCG capsule Take 1 capsule (72 mcg total) by mouth daily before breakfast.  . [  DISCONTINUED] oxyCODONE-acetaminophen (PERCOCET/ROXICET) 5-325 MG tablet Take 1-2 tablets by mouth every 4 (four) hours as needed for moderate pain.    Allergies:   Flagyl [metronidazole] and Benadryl [diphenhydramine hcl]   Social History   Social History  . Marital status: Married    Spouse name: N/A  . Number of children: N/A  . Years of education: N/A   Occupational History  . [employer]   Social History Main Topics  . Smoking status: Never Smoker  . Smokeless tobacco: Never Used  . Alcohol use 1.8 oz/week    3 Glasses of wine per week     Comment: 3-4 per week  . Drug use: No  . Sexual activity: Not Asked   Other Topics Concern  . None   Social History Narrative  . None     Family History:  The patient's family  history includes Arthritis in her father and mother; CAD in her father; CVA in her father; Heart disease in her brother, father, and mother.   ROS:   Please see the history of present illness.    ROS All other systems reviewed and are negative.  PAD Screen 11/20/2016  Previous PAD dx? No  Previous surgical procedure? No  Pain with walking? No  Feet/toe relief with dangling? No  Painful, non-healing ulcers? No  Extremities discolored? No       PHYSICAL EXAM:   VS:  BP (!) 159/74   Pulse 62   Ht 5\' 2"  (1.575 m)   Wt 137 lb 12.8 oz (62.5 kg)   SpO2 96%   BMI 25.20 kg/m    GEN: Well nourished, well developed, in no acute distress  HEENT: normal  Neck: no JVD, carotid bruits, or masses Cardiac: RRR; no murmurs, rubs, or gallops,no edema.  Intact distal pulses bilaterally.  Respiratory:  clear to auscultation bilaterally, normal work of breathing GI: soft, nontender, nondistended, + BS MS: no deformity or atrophy  Skin: warm and dry, no rash Neuro:  Alert and Oriented x 3, Strength and sensation are intact Psych: euthymic mood, full affect  Wt Readings from Last 3 Encounters:  11/20/16 137 lb 12.8 oz (62.5 kg)  06/06/16 130 lb (59 kg)  10/08/15 127 lb 3 oz (57.7 kg)      Studies/Labs Reviewed:   EKG:  EKG is ordered today.  The ekg ordered today demonstrates NSR with no ST changes  Recent Labs: No results found for requested labs within last 8760 hours.   Lipid Panel No results found for: CHOL, TRIG, HDL, CHOLHDL, VLDL, LDLCALC, LDLDIRECT  Additional studies/ records that were reviewed today include:  Office noted from PCP    ASSESSMENT:    1. Chest pain, unspecified type   2. Benign essential HTN   3. Pure hypercholesterolemia      PLAN:  In order of problems listed above:  1. Chest pain  - somewhat atypical in that it has been occurring intermittently for the past 6 years and not associated with any other symptoms including nausea, SOB or diaphoresis.   She does have CRFs including hyperlipidemia, HTN and family history of CAD in her Dad.  EKG is nonischemic. I will get a stress myoview to rule out ischemia and 2D echo to assess LVF.   2. HTN - her BP is elevated today on exam.  She is on Losartan 100mg  daily which she will continue.  I have asked her to check her BP daily for a  week and call with results.  If it remains elevated will add Chlorthalidone 25mg  daily.  3. Hyperlipidemia - followed by PCP.  Continue statin.     Medication Adjustments/Labs and Tests Ordered: Current medicines are reviewed at length with the patient today.  Concerns regarding medicines are outlined above.  Medication changes, Labs and Tests ordered today are listed in the Patient Instructions below.  There are no Patient Instructions on file for this visit.   Signed, Fransico Him, MD  11/20/2016 1:45 PM    [facility] [place], [place], [place]  [address] Phone: [phone]; Fax: [phone]

## 2016-11-20 NOTE — Patient Instructions (Signed)
Medication Instructions:  Your provider recommends that you continue on your current medications as directed. Please refer to the Current Medication list given to you today.    Labwork: None  Testing/Procedures: Your physician has requested that you have an echocardiogram. Echocardiography is a painless test that uses sound waves to create images of your heart. It provides your doctor with information about the size and shape of your heart and how well your heart's chambers and valves are working. This procedure takes approximately one hour. There are no restrictions for this procedure.   Dr. Radford Pax recommends you have a NUCLEAR STRESS TEST.  Follow-Up: Your provider recommends that you schedule a follow-up appointment AS NEEDED with Dr. Radford Pax pending study results.  Any Other Special Instructions Will Be Listed Below (If Applicable). Please check your BLOOD PRESSURE daily for a week and call with results.    If you need a refill on your cardiac medications before your next appointment, please call your pharmacy.

## 2016-11-21 NOTE — Addendum Note (Signed)
Addended by: Jacinta Shoe on: 11/21/2016 12:14 PM   Modules accepted: Orders

## 2016-11-24 ENCOUNTER — Telehealth (HOSPITAL_COMMUNITY): Payer: Self-pay | Admitting: *Deleted

## 2016-11-24 DIAGNOSIS — M25552 Pain in left hip: Secondary | ICD-10-CM | POA: Diagnosis not present

## 2016-11-24 DIAGNOSIS — Z6823 Body mass index (BMI) 23.0-23.9, adult: Secondary | ICD-10-CM | POA: Diagnosis not present

## 2016-11-24 NOTE — Telephone Encounter (Signed)
Patient given detailed instructions per Myocardial Perfusion Study Information Sheet for the test on 11/27/16 at 0715. Patient notified to arrive 15 minutes early and that it is imperative to arrive on time for appointment to keep from having the test rescheduled.  If you need to cancel or reschedule your appointment, please call the office within 24 hours of your appointment. . Patient verbalized understanding.Nashea Chumney, Ranae Palms

## 2016-11-26 MED FILL — FLUoxetine HCL 20 MG CAPS: 20 | 90 days supply | Qty: 90 | Fill #1

## 2016-11-26 MED FILL — SIMVASTATIN 80 MG TABLET: 80 | 90 days supply | Qty: 45 | Fill #2

## 2016-11-27 ENCOUNTER — Telehealth: Payer: Self-pay | Admitting: Cardiology

## 2016-11-27 ENCOUNTER — Ambulatory Visit (HOSPITAL_BASED_OUTPATIENT_CLINIC_OR_DEPARTMENT_OTHER): Payer: 59

## 2016-11-27 ENCOUNTER — Ambulatory Visit (HOSPITAL_COMMUNITY): Payer: 59 | Attending: Cardiovascular Disease

## 2016-11-27 ENCOUNTER — Other Ambulatory Visit: Payer: Self-pay

## 2016-11-27 DIAGNOSIS — I10 Essential (primary) hypertension: Secondary | ICD-10-CM | POA: Insufficient documentation

## 2016-11-27 DIAGNOSIS — E7800 Pure hypercholesterolemia, unspecified: Secondary | ICD-10-CM | POA: Diagnosis not present

## 2016-11-27 DIAGNOSIS — I080 Rheumatic disorders of both mitral and aortic valves: Secondary | ICD-10-CM | POA: Diagnosis not present

## 2016-11-27 DIAGNOSIS — R079 Chest pain, unspecified: Secondary | ICD-10-CM | POA: Insufficient documentation

## 2016-11-27 LAB — MYOCARDIAL PERFUSION IMAGING
CHL CUP NUCLEAR SDS: 7
CHL CUP NUCLEAR SSS: 12
CSEPPHR: 82 {beats}/min
LV dias vol: 77 mL (ref 46–106)
LVSYSVOL: 23 mL
RATE: 0.3
Rest HR: 54 {beats}/min
SRS: 5
TID: 0.88

## 2016-11-27 MED ORDER — REGADENOSON 0.4 MG/5ML IV SOLN
0.4000 mg | Freq: Once | INTRAVENOUS | Status: AC
  Administered 2016-11-27: 0.4 mg via INTRAVENOUS

## 2016-11-27 MED ORDER — PERFLUTREN LIPID MICROSPHERE
1.0000 mL | INTRAVENOUS | Status: AC | PRN
  Administered 2016-11-27: 2 mL via INTRAVENOUS

## 2016-11-27 MED ORDER — TECHNETIUM TC 99M TETROFOSMIN IV KIT
10.2000 | PACK | Freq: Once | INTRAVENOUS | Status: AC | PRN
  Administered 2016-11-27: 10.2 via INTRAVENOUS
  Filled 2016-11-27: qty 11

## 2016-11-27 MED ORDER — TECHNETIUM TC 99M TETROFOSMIN IV KIT
32.8000 | PACK | Freq: Once | INTRAVENOUS | Status: AC | PRN
  Administered 2016-11-27: 32.8 via INTRAVENOUS
  Filled 2016-11-27: qty 33

## 2016-11-27 NOTE — Telephone Encounter (Signed)
Walk-In Patient Form-BP Readings dropped off placed in [facility] doc box.

## 2016-11-28 ENCOUNTER — Telehealth: Payer: Self-pay | Admitting: *Deleted

## 2016-11-28 MED ORDER — CHLORTHALIDONE 25 MG PO TABS: 25.0000 mg | ORAL_TABLET | Freq: Every day | ORAL | 3 refills | Status: DC

## 2016-11-28 MED FILL — CHLORTHALIDONE 25 MG TABLET: 25 | 90 days supply | Qty: 90 | Fill #0

## 2016-11-28 MED FILL — LOSARTAN POTASSIUM 100 MG T: 100 | 90 days supply | Qty: 90 | Fill #0

## 2016-11-28 NOTE — Telephone Encounter (Signed)
BP too high - add chlorthalidone 25mg  daily and check BP daily for a week and call with results

## 2016-11-28 NOTE — Telephone Encounter (Signed)
Patient made aware of Dr. Theodosia Blender recommendations to start chlorthalidone 25 mg QD and to monitor BP QD x 1 week and call back with the results. Patient verbalized understanding and thanked me for the call. Rx sent to patient's preferred pharmacy.

## 2016-11-28 NOTE — Telephone Encounter (Signed)
I called pt to discuss myoview and echo results. Pt states she was asked by Dr Radford Pax to check her BP for a week and if they were high Dr Radford Pax may want to add medication. Pt dropped off list of BP readings 11/27/16:  9/7--157/85 8 AM---137/83 11 AM after med and yoga 9/8--158/90 6 AM 9/9--163/89 6 AM 9/10--136/75 2:30 PM 9/11--178/93 8:30 AM 9/12--156/91 8:30 AM 167/93 5 PM  Pt does not know her pulse.  Pt states morning readings are before she took her losartan and had coffee.  Pt advised I will forward to Dr Radford Pax for review.

## 2016-12-15 ENCOUNTER — Telehealth: Payer: Self-pay | Admitting: Cardiology

## 2016-12-15 NOTE — Telephone Encounter (Signed)
New message    Pt c/o BP issue: STAT if pt c/o blurred vision, one-sided weakness or slurred speech  1. What are your last 5 BP readings? 146/84, 121/76, 132/83, 142/77, 119/73, 142/70, 143/75  2. Are you having any other symptoms (ex. Dizziness, headache, blurred vision, passed out)? No symptoms.   3. What is your BP issue? Pt is calling to report her BP

## 2016-12-15 NOTE — Telephone Encounter (Signed)
BP well controlled -no new changes

## 2016-12-15 NOTE — Telephone Encounter (Signed)
Notified the pt that per Dr Radford Pax, her BPs are well controlled, with no new changes.  Pt verbalized understanding and agrees with this plan.

## 2016-12-23 DIAGNOSIS — I10 Essential (primary) hypertension: Secondary | ICD-10-CM | POA: Diagnosis not present

## 2016-12-23 DIAGNOSIS — E782 Mixed hyperlipidemia: Secondary | ICD-10-CM | POA: Diagnosis not present

## 2016-12-25 DIAGNOSIS — H52221 Regular astigmatism, right eye: Secondary | ICD-10-CM | POA: Diagnosis not present

## 2016-12-25 DIAGNOSIS — H5213 Myopia, bilateral: Secondary | ICD-10-CM | POA: Diagnosis not present

## 2016-12-25 DIAGNOSIS — H524 Presbyopia: Secondary | ICD-10-CM | POA: Diagnosis not present

## 2016-12-26 DIAGNOSIS — Z6823 Body mass index (BMI) 23.0-23.9, adult: Secondary | ICD-10-CM | POA: Diagnosis not present

## 2016-12-26 DIAGNOSIS — Z1239 Encounter for other screening for malignant neoplasm of breast: Secondary | ICD-10-CM | POA: Diagnosis not present

## 2016-12-26 DIAGNOSIS — Z01419 Encounter for gynecological examination (general) (routine) without abnormal findings: Secondary | ICD-10-CM | POA: Diagnosis not present

## 2016-12-26 DIAGNOSIS — R0789 Other chest pain: Secondary | ICD-10-CM | POA: Diagnosis not present

## 2016-12-26 DIAGNOSIS — I10 Essential (primary) hypertension: Secondary | ICD-10-CM | POA: Diagnosis not present

## 2016-12-26 DIAGNOSIS — E782 Mixed hyperlipidemia: Secondary | ICD-10-CM | POA: Diagnosis not present

## 2016-12-26 DIAGNOSIS — M25561 Pain in right knee: Secondary | ICD-10-CM | POA: Diagnosis not present

## 2016-12-26 DIAGNOSIS — T8203XD Leakage of heart valve prosthesis, subsequent encounter: Secondary | ICD-10-CM | POA: Diagnosis not present

## 2016-12-29 DIAGNOSIS — M9902 Segmental and somatic dysfunction of thoracic region: Secondary | ICD-10-CM | POA: Diagnosis not present

## 2016-12-29 DIAGNOSIS — M546 Pain in thoracic spine: Secondary | ICD-10-CM | POA: Diagnosis not present

## 2016-12-29 DIAGNOSIS — I10 Essential (primary) hypertension: Secondary | ICD-10-CM | POA: Diagnosis not present

## 2016-12-29 DIAGNOSIS — M9901 Segmental and somatic dysfunction of cervical region: Secondary | ICD-10-CM | POA: Diagnosis not present

## 2016-12-29 DIAGNOSIS — M542 Cervicalgia: Secondary | ICD-10-CM | POA: Diagnosis not present

## 2017-01-06 DIAGNOSIS — M542 Cervicalgia: Secondary | ICD-10-CM | POA: Diagnosis not present

## 2017-01-06 DIAGNOSIS — M9902 Segmental and somatic dysfunction of thoracic region: Secondary | ICD-10-CM | POA: Diagnosis not present

## 2017-01-06 DIAGNOSIS — M546 Pain in thoracic spine: Secondary | ICD-10-CM | POA: Diagnosis not present

## 2017-01-06 DIAGNOSIS — M9901 Segmental and somatic dysfunction of cervical region: Secondary | ICD-10-CM | POA: Diagnosis not present

## 2017-02-03 DIAGNOSIS — M9902 Segmental and somatic dysfunction of thoracic region: Secondary | ICD-10-CM | POA: Diagnosis not present

## 2017-02-03 DIAGNOSIS — M542 Cervicalgia: Secondary | ICD-10-CM | POA: Diagnosis not present

## 2017-02-03 DIAGNOSIS — M9901 Segmental and somatic dysfunction of cervical region: Secondary | ICD-10-CM | POA: Diagnosis not present

## 2017-02-03 DIAGNOSIS — M546 Pain in thoracic spine: Secondary | ICD-10-CM | POA: Diagnosis not present

## 2017-02-06 DIAGNOSIS — Z6825 Body mass index (BMI) 25.0-25.9, adult: Secondary | ICD-10-CM | POA: Diagnosis not present

## 2017-02-06 DIAGNOSIS — K921 Melena: Secondary | ICD-10-CM | POA: Diagnosis not present

## 2017-02-06 DIAGNOSIS — Z9049 Acquired absence of other specified parts of digestive tract: Secondary | ICD-10-CM | POA: Diagnosis not present

## 2017-02-09 ENCOUNTER — Other Ambulatory Visit: Payer: Self-pay

## 2017-02-09 ENCOUNTER — Other Ambulatory Visit (HOSPITAL_COMMUNITY): Payer: Self-pay | Admitting: Internal Medicine

## 2017-02-09 ENCOUNTER — Telehealth: Payer: Self-pay

## 2017-02-09 ENCOUNTER — Telehealth: Payer: Self-pay | Admitting: Internal Medicine

## 2017-02-09 DIAGNOSIS — Z1231 Encounter for screening mammogram for malignant neoplasm of breast: Secondary | ICD-10-CM

## 2017-02-09 MED ORDER — HYDROCORTISONE 2.5 % RE CREA: 1.0000 "application " | TOPICAL_CREAM | Freq: Two times a day (BID) | RECTAL | 1 refills | Status: DC

## 2017-02-09 NOTE — Telephone Encounter (Signed)
If the medication hasn't been sent yet, please send to [facility]

## 2017-02-09 NOTE — Telephone Encounter (Signed)
Ok. I sent to [facility]. First prescription went to [facility].

## 2017-02-09 NOTE — Telephone Encounter (Signed)
Requested labs and OV from PCP

## 2017-02-09 NOTE — Telephone Encounter (Signed)
Spoke with pt, pt noticed some blood in her stool on Thursday 02/05/17. Pt went to the bathroom a second time on 02/05/17 and noticed some diarrhea with blood. On Friday, pt called our office first, when pt noticed our office was closed, she contacted her pcp. Lab work was done on Friday by pcp. Pt noticed blood in her stool on Friday and Saturday. On Sunday pt noticed dark stool without blood. Pt reports mild pain if any in her stomach, no nausea or vomiting.  Pt has taken off the rest of the day incase she needs to be be seen today. Pt was offered an appointment on 02/11/17.

## 2017-02-09 NOTE — Telephone Encounter (Signed)
noted 

## 2017-02-09 NOTE — Telephone Encounter (Signed)
Pt called to state that Anusol isn't available at [facility]. Pt wanted med called into [facility] on [address].  ERX topical Anusol cr as directed to [facility].

## 2017-02-09 NOTE — Telephone Encounter (Signed)
Let's get the labs as soon as possible from PCP. I am sending in Anusol to take twice a day per rectum to [facility]. Could be secondary to benign source such as hemorrhoids. Does not sound like a diverticular bleed. Will see her Wednesday.

## 2017-02-09 NOTE — Telephone Encounter (Signed)
Pt called wanting to be seen today. I told her I have an opening for 10am Wednesday with AB and nothing else until mid January. She is having rectal bleeding and saw her PCP on Friday. She is wanting to follow up with [place] or either speak to AB. Please call her at [phone] or [phone].

## 2017-02-11 ENCOUNTER — Ambulatory Visit (INDEPENDENT_AMBULATORY_CARE_PROVIDER_SITE_OTHER): Payer: 59 | Admitting: Gastroenterology

## 2017-02-11 ENCOUNTER — Encounter: Payer: Self-pay | Admitting: Gastroenterology

## 2017-02-11 DIAGNOSIS — K625 Hemorrhage of anus and rectum: Secondary | ICD-10-CM | POA: Insufficient documentation

## 2017-02-11 NOTE — Patient Instructions (Addendum)
Continue to use the anusol twice a day for 7 days. Let me know if there is any further rectal bleeding!  Your next colonoscopy is due in 2021.   Have a wonderful holiday season!

## 2017-02-11 NOTE — Progress Notes (Signed)
Referring Provider: Celene Squibb, MD Primary Care Physician:  Celene Squibb, MD Primary GI: Dr. Gala Romney   Chief Complaint  Patient presents with  . Rectal Bleeding    happened 3 time week of thanksgiving    HPI:   Elizabeth Li is a 61 y.o. female presenting today with a history of diverticulitis in March 2015 and April 2017, with laparoscopic sigmoid colectomy undergone in interim from last visit (July 2017). Last colonoscopy in Sept 2016 with diverticulosis. Presents today with new onset rectal bleeding.  Outside labs from October 2018 with Hgb 14.1. Hgb 13.9 on 02/06/17. Noted new onset of rectal bleeding on Thanksgiving. Wiped and saw blood on tissue and in stool. Had a small amount of diarrhea later with blood splatter. Had rectal bleeding on Friday with small stool and small amount of blood. Saturday worked and had bleeding X 2. Seemed to be tapering off then. No bleeding since then. Got Anusol cream and has been using it. No itching, burning, rectal discomfort. Every once in awhile will have vague discomfort in LLQ. Rare straining. Rare Ibuprofen but not daily, taking with food. Declining rectal exam.   Past Medical History:  Diagnosis Date  . Arthritis   . Benign essential HTN 11/20/2016  . Chest pain 11/20/2016  . Diverticulitis   . History of frequent urinary tract infections   . Hypercholesterolemia   . Hyperlipidemia 11/20/2016  . Hypertension   . Wears glasses     Past Surgical History:  Procedure Laterality Date  . COLONOSCOPY  07/15/2008   polyps-done in [place]  . COLONOSCOPY N/A 11/24/2014   Dr. Gala Romney: colonic diverticulosis, melanosis coli, surveillance in 2021  . FOOT SURGERY    . LAPAROSCOPIC PARTIAL COLECTOMY N/A 10/08/2015   Procedure: LAPAROSCOPIC PARTIAL COLECTOMY;  Surgeon: Excell Seltzer, MD;  Location: [facility];  Service: General;  Laterality: N/A;  . TONSILLECTOMY    . TRIGGER FINGER RELEASE Right 02/27/2015   Procedure: RELEASE TRIGGER FINGER/A-1  PULLEY RIGHT LONG FINGER;  Surgeon: Leanora Cover, MD;  Location: [facility];  Service: Orthopedics;  Laterality: Right;    Current Outpatient Medications  Medication Sig Dispense Refill  . aspirin EC 81 MG tablet Take 81 mg by mouth daily.    . chlorthalidone (HYGROTON) 25 MG tablet Take 1 tablet (25 mg total) by mouth daily. 90 tablet 3  . FLUoxetine (PROZAC) 20 MG capsule Take 20 mg by mouth daily.    . Glucosamine-Chondroitin (GLUCOSAMINE CHONDR COMPLEX PO) Take 2 tablets by mouth daily.    . hydrocortisone (ANUSOL-HC) 2.5 % rectal cream Place 1 application rectally 2 (two) times daily. 30 g 1  . losartan (COZAAR) 100 MG tablet Take 100 mg by mouth daily.    . Multiple Vitamin (MULTIVITAMIN WITH MINERALS) TABS tablet Take 1 tablet by mouth daily.    . Omega-3 Fatty Acids (FISH OIL PO) Take 2 capsules by mouth daily.    . polyethylene glycol powder (GLYCOLAX/MIRALAX) powder Take 17 grams by mouth daily. 255 g 3  . simvastatin (ZOCOR) 40 MG tablet Take 40 mg by mouth daily.    Marland Kitchen VITAMIN D, CHOLECALCIFEROL, PO Take 1 tablet by mouth daily.    Marland Kitchen CALCIUM PO Take 2 tablets by mouth daily.      No current facility-administered medications for this visit.     Allergies as of 02/11/2017 - Review Complete 02/11/2017  Allergen Reaction Noted  . Flagyl [metronidazole] Other (See Comments) 10/02/2015  . Benadryl [  diphenhydramine hcl]  11/09/2014    Family History  Problem Relation Age of Onset  . Arthritis Mother   . Heart disease Mother   . Arthritis Father   . Heart disease Father   . CVA Father   . CAD Father        multi vessel  . Heart disease Brother   . Colon cancer Neg Hx     Social History   Socioeconomic History  . Marital status: Married    Spouse name: None  . Number of children: None  . Years of education: None  . Highest education level: None  Social Needs  . Financial resource strain: None  . Food insecurity - worry: None  . Food insecurity -  inability: None  . Transportation needs - medical: None  . Transportation needs - non-medical: None  Occupational History  . Occupation: [employer]    Comment: Biomedical engineer  Tobacco Use  . Smoking status: Never Smoker  . Smokeless tobacco: Never Used  Substance and Sexual Activity  . Alcohol use: Yes    Alcohol/week: 1.8 oz    Types: 3 Glasses of wine per week    Comment: 3-4 per week  . Drug use: No  . Sexual activity: None  Other Topics Concern  . None  Social History Narrative  . None    Review of Systems: Gen: Denies fever, chills, anorexia. Denies fatigue, weakness, weight loss.  CV: Denies chest pain, palpitations, syncope, peripheral edema, and claudication. Resp: Denies dyspnea at rest, cough, wheezing, coughing up blood, and pleurisy. GI: see HPI  Derm: Denies rash, itching, dry skin Psych: Denies depression, anxiety, memory loss, confusion. No homicidal or suicidal ideation.  Heme: see HPI   Physical Exam: BP 126/71   Pulse 72   Temp 97.8 F (36.6 C) (Oral)   Ht 5\' 2"  (1.575 m)   Wt 139 lb 9.6 oz (63.3 kg)   BMI 25.53 kg/m  General:   Alert and oriented. No distress noted. Pleasant and cooperative.  Head:  Normocephalic and atraumatic. Eyes:  Conjuctiva clear without scleral icterus. Mouth:  Oral mucosa pink and moist. Good dentition. No lesions. Abdomen:  +BS, soft, non-tender and non-distended. No rebound or guarding. No HSM or masses noted. Rectal: declined  Msk:  Symmetrical without gross deformities. Normal posture. Extremities:  Without edema. Neurologic:  Alert and  oriented x4 Psych:  Alert and cooperative. Normal mood and affect.

## 2017-02-11 NOTE — Assessment & Plan Note (Addendum)
61 year old female with several episodes of rectal bleeding that does not appear hemodynamically significant and now resolved. Hgb normal. Last colonoscopy fairly recent late 2016. Declined rectal exam today. Will continue Anusol BID for 7 days. She is to call if any further rectal bleeding, as I would have a low threshold for early interval colonoscopy. Otherwise, next colonoscopy due in 2021. Discussed avoidance of constipation and straining.

## 2017-02-12 NOTE — Progress Notes (Signed)
CC'D TO PCP °

## 2017-02-16 MED FILL — SIMVASTATIN 80 MG TABLET: 80 | 90 days supply | Qty: 45 | Fill #3

## 2017-02-16 MED FILL — FLUoxetine HCL 20 MG CAPS: 20 | 90 days supply | Qty: 90 | Fill #2

## 2017-02-17 MED FILL — LOSARTAN POTASSIUM 100 MG T: 100 | 90 days supply | Qty: 90 | Fill #1

## 2017-02-17 MED FILL — CHLORTHALIDONE 25 MG TAB: 25 | 80 days supply | Qty: 80 | Fill #1

## 2017-03-05 DIAGNOSIS — M546 Pain in thoracic spine: Secondary | ICD-10-CM | POA: Diagnosis not present

## 2017-03-05 DIAGNOSIS — M542 Cervicalgia: Secondary | ICD-10-CM | POA: Diagnosis not present

## 2017-03-05 DIAGNOSIS — M9902 Segmental and somatic dysfunction of thoracic region: Secondary | ICD-10-CM | POA: Diagnosis not present

## 2017-03-05 DIAGNOSIS — M9901 Segmental and somatic dysfunction of cervical region: Secondary | ICD-10-CM | POA: Diagnosis not present

## 2017-03-06 ENCOUNTER — Ambulatory Visit (HOSPITAL_COMMUNITY): Payer: 59

## 2017-04-24 ENCOUNTER — Ambulatory Visit (HOSPITAL_COMMUNITY)
Admission: RE | Admit: 2017-04-24 | Discharge: 2017-04-24 | Disposition: A | Discharge status: Home / Self Care | Payer: No Typology Code available for payment source | Source: Ambulatory Visit | Attending: Internal Medicine | Admitting: Internal Medicine

## 2017-04-24 DIAGNOSIS — Z1231 Encounter for screening mammogram for malignant neoplasm of breast: Secondary | ICD-10-CM

## 2017-04-24 DIAGNOSIS — R928 Other abnormal and inconclusive findings on diagnostic imaging of breast: Secondary | ICD-10-CM | POA: Diagnosis not present

## 2017-05-04 ENCOUNTER — Other Ambulatory Visit (HOSPITAL_COMMUNITY): Payer: Self-pay | Admitting: Internal Medicine

## 2017-05-04 DIAGNOSIS — R928 Other abnormal and inconclusive findings on diagnostic imaging of breast: Secondary | ICD-10-CM

## 2017-05-14 MED FILL — FLUoxetine HCL 20 MG CAPS: 20 | 90 days supply | Qty: 90 | Fill #3

## 2017-05-14 MED FILL — CHLORTHALIDONE 25 MG TAB: 25 | 80 days supply | Qty: 80 | Fill #2

## 2017-05-14 MED FILL — LOSARTAN POTASSIUM 100 MG T: 100 | 90 days supply | Qty: 90 | Fill #2

## 2017-05-19 ENCOUNTER — Ambulatory Visit (HOSPITAL_COMMUNITY)
Admission: RE | Admit: 2017-05-19 | Discharge: 2017-05-19 | Disposition: A | Discharge status: Home / Self Care | Payer: No Typology Code available for payment source | Source: Ambulatory Visit | Attending: Internal Medicine | Admitting: Internal Medicine

## 2017-05-19 DIAGNOSIS — R928 Other abnormal and inconclusive findings on diagnostic imaging of breast: Secondary | ICD-10-CM | POA: Diagnosis present

## 2017-05-19 DIAGNOSIS — N6320 Unspecified lump in the left breast, unspecified quadrant: Secondary | ICD-10-CM | POA: Diagnosis present

## 2017-05-20 MED FILL — SIMVASTATIN 80 MG TABLET: 80 | 90 days supply | Qty: 90 | Fill #0

## 2017-08-07 MED FILL — LOSARTAN POTASSIUM 100 MG T: 100 | 90 days supply | Qty: 90 | Fill #3 | Status: TO

## 2017-08-07 MED FILL — CHLORTHALIDONE 25 MG TAB: 25 | 90 days supply | Qty: 90 | Fill #3 | Status: TO

## 2017-08-07 MED FILL — FLUoxetine HCL 20 MG CAPS: 20 | 90 days supply | Qty: 90 | Fill #0 | Status: TO

## 2017-08-11 MED FILL — SIMVASTATIN 80 MG TABLET: 80 | 90 days supply | Qty: 90 | Fill #1 | Status: TO

## 2017-11-05 ENCOUNTER — Other Ambulatory Visit: Payer: Self-pay | Admitting: Cardiology

## 2018-01-28 ENCOUNTER — Other Ambulatory Visit (HOSPITAL_COMMUNITY): Payer: Self-pay | Admitting: Internal Medicine

## 2018-01-28 DIAGNOSIS — Z780 Asymptomatic menopausal state: Secondary | ICD-10-CM

## 2018-02-05 ENCOUNTER — Ambulatory Visit (HOSPITAL_COMMUNITY)
Admission: RE | Admit: 2018-02-05 | Discharge: 2018-02-05 | Disposition: A | Discharge status: Home / Self Care | Payer: No Typology Code available for payment source | Source: Ambulatory Visit | Attending: Internal Medicine | Admitting: Internal Medicine

## 2018-02-05 DIAGNOSIS — Z780 Asymptomatic menopausal state: Secondary | ICD-10-CM | POA: Insufficient documentation

## 2018-04-02 MED FILL — CELECOXIB 200 MG CAP: 200 | 30 days supply | Qty: 30 | Fill #0

## 2018-08-31 ENCOUNTER — Ambulatory Visit (INDEPENDENT_AMBULATORY_CARE_PROVIDER_SITE_OTHER): Payer: No Typology Code available for payment source

## 2018-08-31 ENCOUNTER — Other Ambulatory Visit: Payer: Self-pay

## 2018-08-31 ENCOUNTER — Encounter: Payer: Self-pay | Admitting: Orthopaedic Surgery

## 2018-08-31 ENCOUNTER — Encounter

## 2018-08-31 ENCOUNTER — Ambulatory Visit: Payer: No Typology Code available for payment source | Admitting: Orthopaedic Surgery

## 2018-08-31 VITALS — BP 196/86 | HR 73 | Temp 97.2°F | Ht 62.0 in | Wt 136.0 lb

## 2018-08-31 DIAGNOSIS — M5136 Other intervertebral disc degeneration, lumbar region: Secondary | ICD-10-CM | POA: Diagnosis not present

## 2018-08-31 DIAGNOSIS — M549 Dorsalgia, unspecified: Secondary | ICD-10-CM

## 2018-08-31 DIAGNOSIS — M25512 Pain in left shoulder: Secondary | ICD-10-CM

## 2018-08-31 DIAGNOSIS — G8929 Other chronic pain: Secondary | ICD-10-CM

## 2018-08-31 MED FILL — LOSARTAN POTASSIUM 100 MG T: 100 | 90 days supply | Qty: 90 | Fill #0

## 2018-08-31 MED FILL — SIMVASTATIN 80 MG TABLET: 80 | 90 days supply | Qty: 90 | Fill #0

## 2018-08-31 MED FILL — FLUoxetine HCL 20 MG CAPS: 20 | 90 days supply | Qty: 90 | Fill #0

## 2018-08-31 MED FILL — CHLORTHALIDONE 25 MG TABS: 25 | 90 days supply | Qty: 90 | Fill #0

## 2018-08-31 NOTE — Progress Notes (Signed)
Subjective:    Patient ID: Elizabeth Li, female    DOB: May 06, 1955, 63 y.o.   MRN: [id number]  HPI She has over a 20 year history of left shoulder pain.  She used to live in [place] and was treated there 20 years ago for the left shoulder pain.  She has put up with it since then. She has pain at times with overhead use, other times it does not hurt. She has no swelling, no redness, no numbness. She has not taken anything for it. She has no trauma. She says she is tired of it hurting.  She has lower back pain more on the right also for some time. She was told she had a little scoliosis of the lower back. She has no trauma, she has no numbness or weakness.  Her back pain is slowly getting worse. She is also tired of it hurting. She has not taken anything for it. She has no trauma.   Review of Systems  Constitutional: Positive for activity change.  Musculoskeletal: Positive for arthralgias, back pain and myalgias.  All other systems reviewed and are negative.  For Review of Systems, all other systems reviewed and are negative.  The following is a summary of the past history medically, past history surgically, known current medicines, social history and family history.  This information is gathered electronically by the computer from prior information and documentation.  I review this each visit and have found including this information at this point in the chart is beneficial and informative.   Past Medical History:  Diagnosis Date  . Arthritis   . Benign essential HTN 11/20/2016  . Chest pain 11/20/2016  . Diverticulitis   . History of frequent urinary tract infections   . Hypercholesterolemia   . Hyperlipidemia 11/20/2016  . Hypertension   . Wears glasses     Past Surgical History:  Procedure Laterality Date  . COLONOSCOPY  07/15/2008   polyps-done in [place]  . COLONOSCOPY N/A 11/24/2014   Dr. Gala Romney: colonic diverticulosis, melanosis coli, surveillance in 2021  . FOOT  SURGERY    . LAPAROSCOPIC PARTIAL COLECTOMY N/A 10/08/2015   Procedure: LAPAROSCOPIC PARTIAL COLECTOMY;  Surgeon: Excell Seltzer, MD;  Location: [facility];  Service: General;  Laterality: N/A;  . TONSILLECTOMY    . TRIGGER FINGER RELEASE Right 02/27/2015   Procedure: RELEASE TRIGGER FINGER/A-1 PULLEY RIGHT LONG FINGER;  Surgeon: Leanora Cover, MD;  Location: [facility];  Service: Orthopedics;  Laterality: Right;    Current Outpatient Medications on File Prior to Visit  Medication Sig Dispense Refill  . aspirin EC 81 MG tablet Take 81 mg by mouth daily.    . chlorthalidone (HYGROTON) 25 MG tablet Take 1 tablet (25 mg total) by mouth daily. Please make yearly appt with Dr. Radford Pax for September before anymore refills. 1st attempt 30 tablet 0  . FLUoxetine (PROZAC) 20 MG capsule Take 20 mg by mouth daily.    . Glucosamine-Chondroitin (GLUCOSAMINE CHONDR COMPLEX PO) Take 2 tablets by mouth daily.    Marland Kitchen losartan (COZAAR) 100 MG tablet Take 100 mg by mouth daily.    . Multiple Vitamin (MULTIVITAMIN WITH MINERALS) TABS tablet Take 1 tablet by mouth daily.    . simvastatin (ZOCOR) 40 MG tablet Take 40 mg by mouth daily.    Marland Kitchen VITAMIN D, CHOLECALCIFEROL, PO Take 1 tablet by mouth daily.     No current facility-administered medications on file prior to visit.     Social  History   Socioeconomic History  . Marital status: Married    Spouse name: Not on file  . Number of children: Not on file  . Years of education: Not on file  . Highest education level: Not on file  Occupational History  . Occupation: [employer]    Comment: Biomedical engineer  Social Needs  . Financial resource strain: Not on file  . Food insecurity    Worry: Not on file    Inability: Not on file  . Transportation needs    Medical: Not on file    Non-medical: Not on file  Tobacco Use  . Smoking status: Never Smoker  . Smokeless tobacco: Never Used  Substance and Sexual Activity  . Alcohol use: Yes     Alcohol/week: 3.0 standard drinks    Types: 3 Glasses of wine per week    Comment: 3-4 per week  . Drug use: No  . Sexual activity: Not on file  Lifestyle  . Physical activity    Days per week: Not on file    Minutes per session: Not on file  . Stress: Not on file  Relationships  . Social Herbalist on phone: Not on file    Gets together: Not on file    Attends religious service: Not on file    Active member of club or organization: Not on file    Attends meetings of clubs or organizations: Not on file    Relationship status: Not on file  . Intimate partner violence    Fear of current or ex partner: Not on file    Emotionally abused: Not on file    Physically abused: Not on file    Forced sexual activity: Not on file  Other Topics Concern  . Not on file  Social History Narrative  . Not on file    Family History  Problem Relation Age of Onset  . Arthritis Mother   . Heart disease Mother   . Arthritis Father   . Heart disease Father   . CVA Father   . CAD Father        multi vessel  . Heart disease Brother   . Colon cancer Neg Hx     BP (!) 196/86   Pulse 73   Temp (!) 97.2 F (36.2 C)   Ht 5\' 2"  (1.575 m)   Wt 136 lb (61.7 kg)   BMI 24.87 kg/m   Body mass index is 24.87 kg/m.      Objective:   Physical Exam Vitals signs reviewed.  Constitutional:      Appearance: She is well-developed.  HENT:     Head: Normocephalic and atraumatic.  Eyes:     Conjunctiva/sclera: Conjunctivae normal.     Pupils: Pupils are equal, round, and reactive to light.  Neck:     Musculoskeletal: Normal range of motion and neck supple.  Cardiovascular:     Rate and Rhythm: Normal rate and regular rhythm.  Pulmonary:     Effort: Pulmonary effort is normal.  Abdominal:     Palpations: Abdomen is soft.  Musculoskeletal:     Left shoulder: She exhibits tenderness.     Lumbar back: She exhibits tenderness.       Back:       Arms:  Skin:    General: Skin is  warm and dry.  Neurological:     Mental Status: She is alert and oriented to person, place, and time.  Cranial Nerves: No cranial nerve deficit.     Motor: No abnormal muscle tone.     Coordination: Coordination normal.     Deep Tendon Reflexes: Reflexes are normal and symmetric. Reflexes normal.  Psychiatric:        Behavior: Behavior normal.        Thought Content: Thought content normal.        Judgment: Judgment normal.     X-rays were done of the lumbar spine and left shoulder, reported separately.      Assessment & Plan:   Encounter Diagnoses  Name Primary?  . Pain in joint of left shoulder Yes  . Back pain without radiation   . Chronic left shoulder pain   . Other intervertebral disc degeneration, lumbar region    I will get MRI of the lumbar spine and left shoulder.  I have told her to begin Aleve one bid pc. She can go to two po bid pc.  Return after the MRI scans.  Call if any problem.  Precautions discussed.   Electronically Signed Sanjuana Kava, MD 6/16/202010:27 AM

## 2018-09-22 ENCOUNTER — Other Ambulatory Visit: Payer: Self-pay

## 2018-09-22 ENCOUNTER — Ambulatory Visit (HOSPITAL_COMMUNITY)
Admission: RE | Admit: 2018-09-22 | Discharge: 2018-09-22 | Disposition: A | Discharge status: Home / Self Care | Payer: No Typology Code available for payment source | Source: Ambulatory Visit | Attending: Orthopaedic Surgery | Admitting: Orthopaedic Surgery

## 2018-09-22 DIAGNOSIS — M25512 Pain in left shoulder: Secondary | ICD-10-CM | POA: Diagnosis present

## 2018-09-22 DIAGNOSIS — G8929 Other chronic pain: Secondary | ICD-10-CM | POA: Insufficient documentation

## 2018-09-22 DIAGNOSIS — M5136 Other intervertebral disc degeneration, lumbar region: Secondary | ICD-10-CM

## 2018-09-28 MED FILL — KETOCONAZOLE 2 % SHAM: 2 | 30 days supply | Qty: 120 | Fill #0

## 2018-09-30 ENCOUNTER — Encounter: Payer: Self-pay | Admitting: Orthopaedic Surgery

## 2018-09-30 ENCOUNTER — Ambulatory Visit (INDEPENDENT_AMBULATORY_CARE_PROVIDER_SITE_OTHER): Payer: No Typology Code available for payment source | Admitting: Orthopaedic Surgery

## 2018-09-30 ENCOUNTER — Other Ambulatory Visit: Payer: Self-pay

## 2018-09-30 VITALS — BP 154/76 | HR 64 | Temp 87.2°F | Ht 62.0 in | Wt 133.0 lb

## 2018-09-30 DIAGNOSIS — M549 Dorsalgia, unspecified: Secondary | ICD-10-CM

## 2018-09-30 DIAGNOSIS — G8929 Other chronic pain: Secondary | ICD-10-CM

## 2018-09-30 DIAGNOSIS — M25512 Pain in left shoulder: Secondary | ICD-10-CM | POA: Diagnosis not present

## 2018-09-30 MED ORDER — NAPROXEN 500 MG PO TABS: 500.0000 mg | ORAL_TABLET | Freq: Two times a day (BID) | ORAL | 5 refills | Status: DC

## 2018-09-30 MED FILL — NAPROXEN 500 MG TABLET: 500 | 30 days supply | Qty: 60 | Fill #0

## 2018-09-30 NOTE — Progress Notes (Signed)
Patient Elizabeth Li, female DOB:1955-07-17, 63 y.o. GGY:[id number]  Chief Complaint  Patient presents with  . Shoulder Pain  . Back Pain    HPI  Elizabeth Li is a 63 y.o. female who has lower back pain and shoulder pain on the left.  She had MRI of the shoulder and the back showing: IMPRESSION: 1. Grade 1 anterolisthesis at L5-S1 with disc and advanced facet degeneration. But only mild associated spinal stenosis, and mild to moderate neural foraminal stenosis. 2. Mild for age lumbar spine degeneration elsewhere and no other neural impingement, despite levoconvex lumbar scoliosis.  IMPRESSION: Dominant finding is glenohumeral osteoarthritis with an associated degenerative tear of the posterior labrum.  Mild appearing rotator cuff tendinopathy without tear.  I have explained the findings of both.  She does not need surgery on the back.  I have told her about PT and even possible surgery of the shoulder.  She asked appropriate questions.  I will have her see Dr. Marlou Sa about the shoulder as it is a constant problem.  She is to continue her medicine for her back.  Body mass index is 24.33 kg/m.  ROS  Review of Systems  All other systems reviewed and are negative.  The following is a summary of the past history medically, past history surgically, known current medicines, social history and family history.  This information is gathered electronically by the computer from prior information and documentation.  I review this each visit and have found including this information at this point in the chart is beneficial and informative.    Past Medical History:  Diagnosis Date  . Arthritis   . Benign essential HTN 11/20/2016  . Chest pain 11/20/2016  . Diverticulitis   . History of frequent urinary tract infections   . Hypercholesterolemia   . Hyperlipidemia 11/20/2016  . Hypertension   . Wears glasses     Past Surgical History:  Procedure Laterality Date  .  COLONOSCOPY  07/15/2008   polyps-done in [place]  . COLONOSCOPY N/A 11/24/2014   Dr. Gala Romney: colonic diverticulosis, melanosis coli, surveillance in 2021  . FOOT SURGERY    . LAPAROSCOPIC PARTIAL COLECTOMY N/A 10/08/2015   Procedure: LAPAROSCOPIC PARTIAL COLECTOMY;  Surgeon: Excell Seltzer, MD;  Location: [facility];  Service: General;  Laterality: N/A;  . TONSILLECTOMY    . TRIGGER FINGER RELEASE Right 02/27/2015   Procedure: RELEASE TRIGGER FINGER/A-1 PULLEY RIGHT LONG FINGER;  Surgeon: Leanora Cover, MD;  Location: [facility];  Service: Orthopedics;  Laterality: Right;    Family History  Problem Relation Age of Onset  . Arthritis Mother   . Heart disease Mother   . Arthritis Father   . Heart disease Father   . CVA Father   . CAD Father        multi vessel  . Heart disease Brother   . Colon cancer Neg Hx     Social History Social History   Tobacco Use  . Smoking status: Never Smoker  . Smokeless tobacco: Never Used  Substance Use Topics  . Alcohol use: Yes    Alcohol/week: 3.0 standard drinks    Types: 3 Glasses of wine per week    Comment: 3-4 per week  . Drug use: No    Allergies  Allergen Reactions  . Flagyl [Metronidazole] Other (See Comments)    Causes weakness and dizziness   . Benadryl [Diphenhydramine Hcl]     Feels like on a "drug trip"     Current  Outpatient Medications  Medication Sig Dispense Refill  . aspirin EC 81 MG tablet Take 81 mg by mouth daily.    . chlorthalidone (HYGROTON) 25 MG tablet Take 1 tablet (25 mg total) by mouth daily. Please make yearly appt with Dr. Radford Pax for September before anymore refills. 1st attempt 30 tablet 0  . FLUoxetine (PROZAC) 20 MG capsule Take 20 mg by mouth daily.    . Glucosamine-Chondroitin (GLUCOSAMINE CHONDR COMPLEX PO) Take 2 tablets by mouth daily.    Marland Kitchen losartan (COZAAR) 100 MG tablet Take 100 mg by mouth daily.    . Multiple Vitamin (MULTIVITAMIN WITH MINERALS) TABS tablet Take 1 tablet by  mouth daily.    . naproxen (NAPROSYN) 500 MG tablet Take 1 tablet (500 mg total) by mouth 2 (two) times daily with a meal. 60 tablet 5  . simvastatin (ZOCOR) 40 MG tablet Take 40 mg by mouth daily.    Marland Kitchen VITAMIN D, CHOLECALCIFEROL, PO Take 1 tablet by mouth daily.     No current facility-administered medications for this visit.      Physical Exam  Blood pressure (!) 154/76, pulse 64, temperature (!) 87.2 F (30.7 C), height 5\' 2"  (1.575 m), weight 133 lb (60.3 kg).  Constitutional: overall normal hygiene, normal nutrition, well developed, normal grooming, normal body habitus. Assistive device:none  Musculoskeletal: gait and station Limp none, muscle tone and strength are normal, no tremors or atrophy is present.  .  Neurological: coordination overall normal.  Deep tendon reflex/nerve stretch intact.  Sensation normal.  Cranial nerves II-XII intact.   Skin:   Normal overall no scars, lesions, ulcers or rashes. No psoriasis.  Psychiatric: Alert and oriented x 3.  Recent memory intact, remote memory unclear.  Normal mood and affect. Well groomed.  Good eye contact.  Cardiovascular: overall no swelling, no varicosities, no edema bilaterally, normal temperatures of the legs and arms, no clubbing, cyanosis and good capillary refill.  Lymphatic: palpation is normal.  Spine/Pelvis examination:  Inspection:  Overall, sacoiliac joint benign and hips nontender; without crepitus or defects.   Thoracic spine inspection: Alignment normal without kyphosis present   Lumbar spine inspection:  Alignment  with normal lumbar lordosis, without scoliosis apparent.   Thoracic spine palpation:  without tenderness of spinal processes   Lumbar spine palpation: without tenderness of lumbar area; without tightness of lumbar muscles    Range of Motion:   Lumbar flexion, forward flexion is normal without pain or tenderness    Lumbar extension is full without pain or tenderness   Left lateral bend is  normal without pain or tenderness   Right lateral bend is normal without pain or tenderness   Straight leg raising is normal  Strength & tone: normal   Stability overall normal stability  The shoulder on the left has near full ROM with pain in the extremes.  NV intact. All other systems reviewed and are negative   The patient has been educated about the nature of the problem(s) and counseled on treatment options.  The patient appeared to understand what I have discussed and is in agreement with it.  Encounter Diagnoses  Name Primary?  . Pain in joint of left shoulder Yes  . Chronic left shoulder pain   . Back pain without radiation     PLAN Call if any problems.  Precautions discussed.  Continue current medications.   Return to clinic to see Dr. Marlou Sa about the shoulder on the left, possible surgery vs PT or both  Electronically Signed Sanjuana Kava, MD 7/16/202010:23 AM

## 2018-10-06 ENCOUNTER — Encounter: Payer: Self-pay | Admitting: Orthopedic Surgery

## 2018-10-06 ENCOUNTER — Ambulatory Visit (INDEPENDENT_AMBULATORY_CARE_PROVIDER_SITE_OTHER): Payer: No Typology Code available for payment source | Admitting: Orthopedic Surgery

## 2018-10-06 ENCOUNTER — Other Ambulatory Visit: Payer: Self-pay

## 2018-10-06 DIAGNOSIS — M19012 Primary osteoarthritis, left shoulder: Secondary | ICD-10-CM | POA: Diagnosis not present

## 2018-10-07 ENCOUNTER — Encounter: Payer: Self-pay | Admitting: Orthopedic Surgery

## 2018-10-07 DIAGNOSIS — M19012 Primary osteoarthritis, left shoulder: Secondary | ICD-10-CM

## 2018-10-07 MED ORDER — LIDOCAINE HCL 1 % IJ SOLN
5.0000 mL | INTRAMUSCULAR | Status: AC | PRN
  Administered 2018-10-07: 5 mL

## 2018-10-07 MED ORDER — METHYLPREDNISOLONE ACETATE 40 MG/ML IJ SUSP
40.0000 mg | INTRAMUSCULAR | Status: AC | PRN
  Administered 2018-10-07: 09:00:00 40 mg via INTRA_ARTICULAR

## 2018-10-07 MED ORDER — BUPIVACAINE HCL 0.5 % IJ SOLN
9.0000 mL | INTRAMUSCULAR | Status: AC | PRN
  Administered 2018-10-07: 09:00:00 9 mL via INTRA_ARTICULAR

## 2018-10-07 NOTE — Progress Notes (Signed)
Office Visit Note   Patient: Elizabeth Li           Date of Birth: [birth year]           MRN: [id number] Visit Date: 10/06/2018 Requested by: Celene Squibb, MD [place],  [place] [address] PCP: Celene Squibb, MD  Subjective: Chief Complaint  Patient presents with  . Left Shoulder - Pain    HPI: Elizabeth Li is a patient with left shoulder pain.'s been going on for 12 months.  It is getting little bit worse.  She likes to workout on a daily basis.  She thinks she may have injured it while she was working up.  She reports decreased strength and some decreased range of motion due to pain.  She has some popping with certain movements.  She is right-hand dominant.  Tried Aleve twice a day without much relief.  MRI scan is reviewed and it does show some very mild early shoulder arthritis with inferior humeral head osteophyte as well as degenerative posterior labral tear with some early geode formation in that posterior inferior glenoid.  Patient also has tendinosis of that rotator cuff posteriorly.  She states it is hard for her to open up the last door.  The pain does not wake her from sleep at night.              ROS: All systems reviewed are negative as they relate to the chief complaint within the history of present illness.  Patient denies  fevers or chills.   Assessment & Plan: Visit Diagnoses:  1. Arthritis of left shoulder region     Plan: Impression is left shoulder pain with degenerative posterior labral fraying and tearing with intact rotator cuff strength as well as structural tendons.  She does have some early arthritis in that left shoulder also.  No evidence of frozen shoulder.  I will try an intra-articular injection today see how she does.  Could repeat that in 3 to 4 months.  We will see her back then if she is not improved.  I think it is okay for her to resume workouts in 2 days.  Follow-Up Instructions: Return if symptoms worsen or fail to improve.   Orders:   No orders of the defined types were placed in this encounter.  No orders of the defined types were placed in this encounter.     Procedures: Large Joint Inj: L glenohumeral on 10/07/2018 9:20 AM Indications: diagnostic evaluation and pain Details: 18 G 1.5 in needle, posterior approach  Arthrogram: No  Medications: 9 mL bupivacaine 0.5 %; 40 mg methylPREDNISolone acetate 40 MG/ML; 5 mL lidocaine 1 % Outcome: tolerated well, no immediate complications Procedure, treatment alternatives, risks and benefits explained, specific risks discussed. Consent was given by the patient. Immediately prior to procedure a time out was called to verify the correct patient, procedure, equipment, support staff and site/side marked as required. Patient was prepped and draped in the usual sterile fashion.       Clinical Data: No additional findings.  Objective: Vital Signs: There were no vitals taken for this visit.  Physical Exam:   Constitutional: Patient appears well-developed HEENT:  Head: Normocephalic Eyes:EOM are normal Neck: Normal range of motion Cardiovascular: Normal rate Pulmonary/chest: Effort normal Neurologic: Patient is alert Skin: Skin is warm Psychiatric: Patient has normal mood and affect    Ortho Exam: Ortho exam demonstrates full active and passive range of motion of that left shoulder.  She has good rotator cuff strength negative apprehension.  Not much in the way of coarse grinding or crepitus and no restriction of passive range of motion left versus right.  No discrete AC joint tenderness.  Negative O'Brien's testing.  No other masses lymphadenopathy or skin changes noted in that left shoulder girdle region.  Specialty Comments:  No specialty comments available.  Imaging: No results found.   PMFS History: Patient Active Problem List   Diagnosis Date Noted  . Rectal bleeding 02/11/2017  . Chest pain 11/20/2016  . Benign essential HTN 11/20/2016  .  Hyperlipidemia 11/20/2016  . Abnormal CT scan, colon   . Diverticulitis of colon 11/09/2014  . Abdominal bloating 11/09/2014   Past Medical History:  Diagnosis Date  . Arthritis   . Benign essential HTN 11/20/2016  . Chest pain 11/20/2016  . Diverticulitis   . History of frequent urinary tract infections   . Hypercholesterolemia   . Hyperlipidemia 11/20/2016  . Hypertension   . Wears glasses     Family History  Problem Relation Age of Onset  . Arthritis Mother   . Heart disease Mother   . Arthritis Father   . Heart disease Father   . CVA Father   . CAD Father        multi vessel  . Heart disease Brother   . Colon cancer Neg Hx     Past Surgical History:  Procedure Laterality Date  . COLONOSCOPY  07/15/2008   polyps-done in [place]  . COLONOSCOPY N/A 11/24/2014   Dr. Gala Romney: colonic diverticulosis, melanosis coli, surveillance in 2021  . FOOT SURGERY    . LAPAROSCOPIC PARTIAL COLECTOMY N/A 10/08/2015   Procedure: LAPAROSCOPIC PARTIAL COLECTOMY;  Surgeon: Excell Seltzer, MD;  Location: [facility];  Service: General;  Laterality: N/A;  . TONSILLECTOMY    . TRIGGER FINGER RELEASE Right 02/27/2015   Procedure: RELEASE TRIGGER FINGER/A-1 PULLEY RIGHT LONG FINGER;  Surgeon: Leanora Cover, MD;  Location: [place];  Service: Orthopedics;  Laterality: Right;   Social History   Occupational History  . Occupation: [employer]    Comment: Biomedical engineer  Tobacco Use  . Smoking status: Never Smoker  . Smokeless tobacco: Never Used  Substance and Sexual Activity  . Alcohol use: Yes    Alcohol/week: 3.0 standard drinks    Types: 3 Glasses of wine per week    Comment: 3-4 per week  . Drug use: No  . Sexual activity: Not on file

## 2018-10-16 ENCOUNTER — Observation Stay (HOSPITAL_COMMUNITY)
Admission: EM | Admit: 2018-10-16 | Discharge: 2018-10-17 | Disposition: A | Discharge status: Home / Self Care | Payer: No Typology Code available for payment source | Attending: Internal Medicine | Admitting: Internal Medicine

## 2018-10-16 ENCOUNTER — Other Ambulatory Visit: Payer: Self-pay

## 2018-10-16 ENCOUNTER — Encounter (HOSPITAL_COMMUNITY): Payer: Self-pay | Admitting: Emergency Medicine

## 2018-10-16 ENCOUNTER — Emergency Department (HOSPITAL_COMMUNITY): Payer: No Typology Code available for payment source

## 2018-10-16 ENCOUNTER — Observation Stay (HOSPITAL_COMMUNITY): Payer: No Typology Code available for payment source

## 2018-10-16 DIAGNOSIS — R42 Dizziness and giddiness: Principal | ICD-10-CM | POA: Insufficient documentation

## 2018-10-16 DIAGNOSIS — I10 Essential (primary) hypertension: Secondary | ICD-10-CM | POA: Insufficient documentation

## 2018-10-16 DIAGNOSIS — F329 Major depressive disorder, single episode, unspecified: Secondary | ICD-10-CM | POA: Diagnosis not present

## 2018-10-16 DIAGNOSIS — E785 Hyperlipidemia, unspecified: Secondary | ICD-10-CM | POA: Diagnosis not present

## 2018-10-16 DIAGNOSIS — Z79899 Other long term (current) drug therapy: Secondary | ICD-10-CM | POA: Insufficient documentation

## 2018-10-16 DIAGNOSIS — E876 Hypokalemia: Secondary | ICD-10-CM | POA: Insufficient documentation

## 2018-10-16 DIAGNOSIS — F419 Anxiety disorder, unspecified: Secondary | ICD-10-CM | POA: Insufficient documentation

## 2018-10-16 DIAGNOSIS — Z7982 Long term (current) use of aspirin: Secondary | ICD-10-CM | POA: Diagnosis not present

## 2018-10-16 DIAGNOSIS — Z20828 Contact with and (suspected) exposure to other viral communicable diseases: Secondary | ICD-10-CM | POA: Insufficient documentation

## 2018-10-16 DIAGNOSIS — E782 Mixed hyperlipidemia: Secondary | ICD-10-CM

## 2018-10-16 LAB — URINALYSIS, COMPLETE (UACMP) WITH MICROSCOPIC
Bilirubin Urine: NEGATIVE
Glucose, UA: NEGATIVE mg/dL
Hgb urine dipstick: NEGATIVE
Ketones, ur: NEGATIVE mg/dL
Nitrite: NEGATIVE
Protein, ur: NEGATIVE mg/dL
Specific Gravity, Urine: 1.015 (ref 1.005–1.030)
pH: 7 (ref 5.0–8.0)

## 2018-10-16 LAB — CBC WITH DIFFERENTIAL/PLATELET
Abs Immature Granulocytes: 0.03 10*3/uL (ref 0.00–0.07)
Basophils Absolute: 0 10*3/uL (ref 0.0–0.1)
Basophils Relative: 1 %
Eosinophils Absolute: 0.1 10*3/uL (ref 0.0–0.5)
Eosinophils Relative: 1 %
HCT: 40.5 % (ref 36.0–46.0)
Hemoglobin: 14.1 g/dL (ref 12.0–15.0)
Immature Granulocytes: 1 %
Lymphocytes Relative: 24 %
Lymphs Abs: 1.6 10*3/uL (ref 0.7–4.0)
MCH: 30.9 pg (ref 26.0–34.0)
MCHC: 34.8 g/dL (ref 30.0–36.0)
MCV: 88.6 fL (ref 80.0–100.0)
Monocytes Absolute: 0.6 10*3/uL (ref 0.1–1.0)
Monocytes Relative: 9 %
Neutro Abs: 4.3 10*3/uL (ref 1.7–7.7)
Neutrophils Relative %: 64 %
Platelets: 290 10*3/uL (ref 150–400)
RBC: 4.57 MIL/uL (ref 3.87–5.11)
RDW: 11.9 % (ref 11.5–15.5)
WBC: 6.6 10*3/uL (ref 4.0–10.5)
nRBC: 0.3 % — ABNORMAL HIGH (ref 0.0–0.2)

## 2018-10-16 LAB — COMPREHENSIVE METABOLIC PANEL
ALT: 27 U/L (ref 0–44)
AST: 22 U/L (ref 15–41)
Albumin: 4.1 g/dL (ref 3.5–5.0)
Alkaline Phosphatase: 65 U/L (ref 38–126)
Anion gap: 12 (ref 5–15)
BUN: 20 mg/dL (ref 8–23)
CO2: 26 mmol/L (ref 22–32)
Calcium: 9.1 mg/dL (ref 8.9–10.3)
Chloride: 99 mmol/L (ref 98–111)
Creatinine, Ser: 0.51 mg/dL (ref 0.44–1.00)
GFR calc Af Amer: >60 mL/min (ref >60)
GFR calc non Af Amer: >60 mL/min (ref >60)
Glucose, Bld: 133 mg/dL — ABNORMAL HIGH (ref 70–99)
Potassium: 2.4 mmol/L — CL (ref 3.5–5.1)
Sodium: 137 mmol/L (ref 135–145)
Total Bilirubin: 0.7 mg/dL (ref 0.3–1.2)
Total Protein: 6.8 g/dL (ref 6.5–8.1)

## 2018-10-16 LAB — MAGNESIUM
Magnesium: 2.2 mg/dL (ref 1.7–2.4)
Magnesium: 2.2 mg/dL (ref 1.7–2.4)

## 2018-10-16 LAB — HEMOGLOBIN A1C
Hgb A1c MFr Bld: 5.7 % — ABNORMAL HIGH (ref 4.8–5.6)
Mean Plasma Glucose: 116.89 mg/dL

## 2018-10-16 LAB — SARS CORONAVIRUS 2 BY RT PCR (HOSPITAL ORDER, PERFORMED IN ~~LOC~~ HOSPITAL LAB): SARS Coronavirus 2: NEGATIVE

## 2018-10-16 MED ORDER — POTASSIUM CHLORIDE 10 MEQ/100ML IV SOLN
10.0000 meq | INTRAVENOUS | Status: AC
  Administered 2018-10-16 (×3): 10 meq via INTRAVENOUS
  Filled 2018-10-16 (×3): qty 100

## 2018-10-16 MED ORDER — ACETAMINOPHEN 325 MG PO TABS
650.0000 mg | ORAL_TABLET | Freq: Four times a day (QID) | ORAL | Status: DC | PRN
  Administered 2018-10-16: 18:00:00 650 mg via ORAL
  Filled 2018-10-16: qty 2

## 2018-10-16 MED ORDER — ACETAMINOPHEN 650 MG RE SUPP: 650.0000 mg | Freq: Four times a day (QID) | RECTAL | Status: DC | PRN

## 2018-10-16 MED ORDER — ASPIRIN EC 81 MG PO TBEC
81.0000 mg | DELAYED_RELEASE_TABLET | Freq: Every day | ORAL | Status: DC
  Administered 2018-10-16 – 2018-10-17 (×2): 81 mg via ORAL
  Filled 2018-10-16 (×2): qty 1

## 2018-10-16 MED ORDER — SUMATRIPTAN SUCCINATE 50 MG PO TABS
50.0000 mg | ORAL_TABLET | Freq: Once | ORAL | Status: AC
  Administered 2018-10-16: 50 mg via ORAL
  Filled 2018-10-16: qty 1

## 2018-10-16 MED ORDER — FLUOXETINE HCL 20 MG PO CAPS
20.0000 mg | ORAL_CAPSULE | Freq: Every day | ORAL | Status: DC
  Administered 2018-10-16 – 2018-10-17 (×2): 20 mg via ORAL
  Filled 2018-10-16 (×2): qty 1

## 2018-10-16 MED ORDER — ONDANSETRON HCL 4 MG PO TABS: 4.0000 mg | ORAL_TABLET | Freq: Four times a day (QID) | ORAL | Status: DC | PRN

## 2018-10-16 MED ORDER — POTASSIUM CHLORIDE CRYS ER 20 MEQ PO TBCR
40.0000 meq | EXTENDED_RELEASE_TABLET | Freq: Once | ORAL | Status: AC
  Administered 2018-10-16: 10:00:00 40 meq via ORAL
  Filled 2018-10-16: qty 2

## 2018-10-16 MED ORDER — ENOXAPARIN SODIUM 40 MG/0.4ML ~~LOC~~ SOLN
40.0000 mg | SUBCUTANEOUS | Status: DC
  Administered 2018-10-16 – 2018-10-17 (×2): 40 mg via SUBCUTANEOUS
  Filled 2018-10-16 (×2): qty 0.4

## 2018-10-16 MED ORDER — LORAZEPAM 2 MG/ML IJ SOLN
1.0000 mg | Freq: Once | INTRAMUSCULAR | Status: AC
  Administered 2018-10-16: 07:00:00 1 mg via INTRAVENOUS
  Filled 2018-10-16: qty 1

## 2018-10-16 MED ORDER — MECLIZINE HCL 12.5 MG PO TABS
25.0000 mg | ORAL_TABLET | Freq: Three times a day (TID) | ORAL | Status: DC | PRN
  Administered 2018-10-17: 11:00:00 25 mg via ORAL
  Filled 2018-10-16: qty 2

## 2018-10-16 MED ORDER — MECLIZINE HCL 12.5 MG PO TABS
25.0000 mg | ORAL_TABLET | Freq: Once | ORAL | Status: AC
  Administered 2018-10-16: 07:00:00 25 mg via ORAL
  Filled 2018-10-16: qty 2

## 2018-10-16 MED ORDER — SIMVASTATIN 20 MG PO TABS
40.0000 mg | ORAL_TABLET | Freq: Every day | ORAL | Status: DC
  Administered 2018-10-16 – 2018-10-17 (×2): 40 mg via ORAL
  Filled 2018-10-16: qty 2
  Filled 2018-10-16: qty 4

## 2018-10-16 MED ORDER — ONDANSETRON HCL 4 MG/2ML IJ SOLN
4.0000 mg | Freq: Once | INTRAMUSCULAR | Status: AC
  Administered 2018-10-16: 07:00:00 4 mg via INTRAVENOUS
  Filled 2018-10-16: qty 2

## 2018-10-16 MED ORDER — LOSARTAN POTASSIUM 50 MG PO TABS
100.0000 mg | ORAL_TABLET | Freq: Every day | ORAL | Status: DC
  Administered 2018-10-16 – 2018-10-17 (×2): 100 mg via ORAL
  Filled 2018-10-16: qty 2
  Filled 2018-10-16: qty 4

## 2018-10-16 MED ORDER — POTASSIUM CHLORIDE CRYS ER 20 MEQ PO TBCR
40.0000 meq | EXTENDED_RELEASE_TABLET | Freq: Once | ORAL | Status: AC
  Administered 2018-10-16: 18:00:00 40 meq via ORAL
  Filled 2018-10-16: qty 2

## 2018-10-16 MED ORDER — ADULT MULTIVITAMIN W/MINERALS CH
1.0000 | ORAL_TABLET | Freq: Every day | ORAL | Status: DC
  Administered 2018-10-16 – 2018-10-17 (×2): 1 via ORAL
  Filled 2018-10-16 (×2): qty 1

## 2018-10-16 MED ORDER — ONDANSETRON HCL 4 MG/2ML IJ SOLN: 4.0000 mg | Freq: Four times a day (QID) | INTRAMUSCULAR | Status: DC | PRN

## 2018-10-16 NOTE — H&P (Addendum)
History and Physical  Elizabeth Li BZJ:[id number] DOB: Jun 16, 1955 DOA: 10/16/2018   PCP: Celene Squibb, MD   Patient coming from: Home  Chief Complaint: vertigo  HPI:  Elizabeth Li is a 63 y.o. female with medical history of hypertension, hyperlipidemia, depression presenting with vertigo of acute onset when she woke up at 4 AM on 10/16/2018.  The patient had been in her usual state of health prior to the development of the above symptoms.  She has not had any dizziness or vertigo type symptoms with rotation of her neck/head prior to this morning.  She had denied any previous fevers, chills, viral prodrome, headache, chest pain, shortness breath, nausea, vomiting, diarrhea, abdominal pain, dysuria.  The patient states that her only new medication is omeprazole which she started 2 weeks prior to this admission.  She denies any new over-the-counter supplements or medications.  She denies any recent sick contacts.  The patient had associated nausea and vomiting x8 with her vertigo.  She was unsteady on her feet and required assistance of her spouse to get to the bathroom.  Because of her persistent symptoms, the patient presented for further evaluation.  She denied any actual headache, visual disturbance, focal extremity weakness, dysesthesia, dysarthria, or dysphasia. In the emergency department, the patient was afebrile hemodynamically stable saturating 100% room air.  LFTs and CBC were unremarkable.  BMP showed a potassium of 2.4.  CT of the brain was negative.  Due to concerns for posterior circulation stroke, the patient is being transferred to [facility] for MRI brain.  Assessment/Plan: Vertigo -Concern for posterior circulation stroke -MRI brain -Other considerations include vestibular neuritis versus BPPV -Obtain urinalysis -PT evaluation -Consult neurology if MRI brain suggests stroke -Antivert as needed -Continue aspirin 81 mg daily -monitor on tele -obtain EKG -pt cannot  tolerate Dix-Hallpike maneuver at this time, but no nystagmus noted at rest  Hypokalemia -Presented with potassium 2.4 -Give additional 40 mEq potassium in addition to the 70 given in the emergency department -Check magnesium -Likely due to the patient's chlorthalidone and vomiting -Hold chlorthalidone temporarily  Essential hypertension -Continue losartan -Holding Chlorthalidone  Hyperlipidemia -Continue simvastatin  Depression/anxiety -Continue fluoxetine       Past Medical History:  Diagnosis Date  . Arthritis   . Benign essential HTN 11/20/2016  . Chest pain 11/20/2016  . Diverticulitis   . History of frequent urinary tract infections   . Hypercholesterolemia   . Hyperlipidemia 11/20/2016  . Hypertension   . Wears glasses    Past Surgical History:  Procedure Laterality Date  . COLONOSCOPY  07/15/2008   polyps-done in [place]  . COLONOSCOPY N/A 11/24/2014   Dr. Gala Romney: colonic diverticulosis, melanosis coli, surveillance in 2021  . FOOT SURGERY    . LAPAROSCOPIC PARTIAL COLECTOMY N/A 10/08/2015   Procedure: LAPAROSCOPIC PARTIAL COLECTOMY;  Surgeon: Excell Seltzer, MD;  Location: [facility];  Service: General;  Laterality: N/A;  . TONSILLECTOMY    . TRIGGER FINGER RELEASE Right 02/27/2015   Procedure: RELEASE TRIGGER FINGER/A-1 PULLEY RIGHT LONG FINGER;  Surgeon: Leanora Cover, MD;  Location: [place];  Service: Orthopedics;  Laterality: Right;   Social History:  reports that she has never smoked. She has never used smokeless tobacco. She reports current alcohol use of about 3.0 standard drinks of alcohol per week. She reports that she does not use drugs.   Family History  Problem Relation Age of Onset  . Arthritis Mother   . Heart  disease Mother   . Arthritis Father   . Heart disease Father   . CVA Father   . CAD Father        multi vessel  . Heart disease Brother   . Colon cancer Neg Hx      Allergies  Allergen Reactions  . Flagyl  [Metronidazole] Other (See Comments)    Causes weakness and dizziness      Prior to Admission medications   Medication Sig Start Date End Date Taking? Authorizing Provider  aspirin EC 81 MG tablet Take 81 mg by mouth daily.    [provider]  chlorthalidone (HYGROTON) 25 MG tablet Take 1 tablet (25 mg total) by mouth daily. Please make yearly appt with Dr. Radford Pax for September before anymore refills. 1st attempt 11/05/17   Sueanne Margarita, MD  FLUoxetine (PROZAC) 20 MG capsule Take 20 mg by mouth daily.    [provider]  Glucosamine-Chondroitin (GLUCOSAMINE CHONDR COMPLEX PO) Take 2 tablets by mouth daily.    [provider]  ketoconazole (NIZORAL) 2 % shampoo Apply 1 application topically daily as needed. 09/28/18   [provider]  losartan (COZAAR) 100 MG tablet Take 100 mg by mouth daily.    [provider]  Multiple Vitamin (MULTIVITAMIN WITH MINERALS) TABS tablet Take 1 tablet by mouth daily.    [provider]  naproxen (NAPROSYN) 500 MG tablet Take 1 tablet (500 mg total) by mouth 2 (two) times daily with a meal. 09/30/18   Sanjuana Kava, MD  omeprazole (PRILOSEC) 20 MG capsule Take 1 capsule by mouth daily. 10/12/18   [provider]  simvastatin (ZOCOR) 40 MG tablet Take 40 mg by mouth daily.    [provider]  simvastatin (ZOCOR) 80 MG tablet Take 1 tablet by mouth daily. 08/31/18   [provider]  VITAMIN D, CHOLECALCIFEROL, PO Take 1 tablet by mouth daily.    [provider]  zolpidem (AMBIEN) 5 MG tablet Take 1 tablet by mouth at bedtime as needed. 10/12/18   [provider]    Review of Systems:  Constitutional:  No weight loss, night sweats, Fevers, chills, fatigue.  Head&Eyes: No headache.  No vision loss.  No eye pain or scotoma ENT:  No Difficulty swallowing,Tooth/dental problems,Sore throat,  No ear ache, post nasal drip,  Cardio-vascular:  No chest pain, Orthopnea,  PND, swelling in lower extremities,  dizziness, palpitations  GI:  No  abdominal pain, nausea, vomiting, diarrhea, loss of appetite, hematochezia, melena, heartburn, indigestion, Resp:  No shortness of breath with exertion or at rest. No cough. No coughing up of blood .No wheezing.No chest wall deformity  Skin:  no rash or lesions.  GU:  no dysuria, change in color of urine, no urgency or frequency. No flank pain.  Musculoskeletal:  No joint pain or swelling. No decreased range of motion. No back pain.  Psych:  No change in mood or affect. No depression or anxiety. Neurologic: No headache, no dysesthesia, no focal weakness, no vision loss. No syncope. Complains of vertigo  Physical Exam: Vitals:   10/16/18 0730 10/16/18 0800 10/16/18 0830 10/16/18 0900  BP: (!) 153/65 (!) 172/68 (!) 150/67 (!) 155/72  Pulse:   (!) 56 61  Resp: 18 18 18 18   Temp:      TempSrc:      SpO2: 94% 95% 95% 92%  Weight:      Height:       General:  A&O x 3, NAD, nontoxic,  pleasant/cooperative Head/Eye: No conjunctival hemorrhage, no icterus, [place]/AT, No nystagmus ENT:  No icterus,  No thrush, good dentition, no pharyngeal exudate Neck:  No masses, no lymphadenpathy, no bruits CV:  RRR, no rub, no gallop, no S3 Lung:  CTAB, good air movement, no wheeze, no rhonchi Abdomen: soft/NT, +BS, nondistended, no peritoneal signs Ext: No cyanosis, No rashes, No petechiae, No lymphangitis, No edema Neuro: CNII-XII intact, strength 4/5 in bilateral upper and lower extremities, no dysmetria  Labs on Admission:  Basic Metabolic Panel: Recent Labs  Lab 10/16/18 0638  NA 137  K 2.4*  CL 99  CO2 26  GLUCOSE 133*  BUN 20  CREATININE 0.51  CALCIUM 9.1   Liver Function Tests: Recent Labs  Lab 10/16/18 0638  AST 22  ALT 27  ALKPHOS 65  BILITOT 0.7  PROT 6.8  ALBUMIN 4.1   No results for input(s): LIPASE, AMYLASE in the last 168 hours. No results for input(s): AMMONIA in the last 168 hours. CBC:  Recent Labs  Lab 10/16/18 0638  WBC 6.6  NEUTROABS 4.3  HGB 14.1  HCT 40.5  MCV 88.6  PLT 290   Coagulation Profile: No results for input(s): INR, PROTIME in the last 168 hours. Cardiac Enzymes: No results for input(s): CKTOTAL, CKMB, CKMBINDEX, TROPONINI in the last 168 hours. BNP: Invalid input(s): POCBNP CBG: No results for input(s): GLUCAP in the last 168 hours. Urine analysis: No results found for: COLORURINE, APPEARANCEUR, LABSPEC, PHURINE, GLUCOSEU, HGBUR, BILIRUBINUR, KETONESUR, PROTEINUR, UROBILINOGEN, NITRITE, LEUKOCYTESUR Sepsis Labs: @LABRCNTIP (procalcitonin:4,lacticidven:4) )No results found for this or any previous visit (from the past 240 hour(s)).   Radiological Exams on Admission: Ct Head Wo Contrast  Result Date: 10/16/2018 CLINICAL DATA:  Dizziness this morning EXAM: CT HEAD WITHOUT CONTRAST TECHNIQUE: Contiguous axial images were obtained from the base of the skull through the vertex without intravenous contrast. COMPARISON:  None. FINDINGS: Brain: No evidence of acute infarction, hemorrhage, hydrocephalus, extra-axial collection or mass lesion/mass effect. There is mild chronic diffuse atrophy. Vascular: No hyperdense vessel noted. Skull: Normal. Negative for fracture or focal lesion. Sinuses/Orbits: No acute finding. Other: None IMPRESSION: No focal acute intracranial abnormality identified. Electronically Signed   By: Abelardo Diesel M.D.   On: 10/16/2018 08:23    EKG: Independently reviewed. pending    Time spent:60 minutes Code Status:   FULL Family Communication:  No Family at bedside Disposition Plan: expect 1-2 day hospitalization Consults called: none DVT Prophylaxis: [place] Lovenox  Orson Eva, DO  [facility] Pager [phone]  If 7PM-7AM, please contact night-coverage [email or web address] Password [facility] 10/16/2018, 9:41 AM

## 2018-10-16 NOTE — ED Notes (Signed)
CRITICAL VALUE ALERT  Critical Value:  Potassium 2.4   Date & Time Notied:  0720, 10/16/2018  Provider Notified: Dr. Tomi Bamberger  Orders Received/Actions taken: no new orders

## 2018-10-16 NOTE — ED Provider Notes (Signed)
[facility] EMERGENCY DEPARTMENT Provider Note   CSN: [id number] Arrival date & time: 10/16/18  7902  Time seen 6:30 AM  History   Chief Complaint Chief Complaint  Patient presents with  . Dizziness  . Emesis    HPI Elizabeth Li is a 63 y.o. female.     HPI patient states she woke up at 430 this morning and immediately realized that she was not feeling well, she states she has dizziness and a feeling of a spinning sensation.  She also states she cannot hear out of her left ear.  She has had nausea and vomited about 8 times.  She denies headache.  She states she was able to ambulate but only with assistance by her husband.  She states any movement of her head makes the dizziness worse, she denies any numbness or tingling of her extremities.  She states she is never had this happen before.  PCP Celene Squibb, MD    Past Medical History:  Diagnosis Date  . Arthritis   . Benign essential HTN 11/20/2016  . Chest pain 11/20/2016  . Diverticulitis   . History of frequent urinary tract infections   . Hypercholesterolemia   . Hyperlipidemia 11/20/2016  . Hypertension   . Wears glasses     Patient Active Problem List   Diagnosis Date Noted  . Rectal bleeding 02/11/2017  . Chest pain 11/20/2016  . Benign essential HTN 11/20/2016  . Hyperlipidemia 11/20/2016  . Abnormal CT scan, colon   . Diverticulitis of colon 11/09/2014  . Abdominal bloating 11/09/2014    Past Surgical History:  Procedure Laterality Date  . COLONOSCOPY  07/15/2008   polyps-done in [place]  . COLONOSCOPY N/A 11/24/2014   Dr. Gala Romney: colonic diverticulosis, melanosis coli, surveillance in 2021  . FOOT SURGERY    . LAPAROSCOPIC PARTIAL COLECTOMY N/A 10/08/2015   Procedure: LAPAROSCOPIC PARTIAL COLECTOMY;  Surgeon: Excell Seltzer, MD;  Location: [facility];  Service: General;  Laterality: N/A;  . TONSILLECTOMY    . TRIGGER FINGER RELEASE Right 02/27/2015   Procedure: RELEASE TRIGGER FINGER/A-1 PULLEY RIGHT  LONG FINGER;  Surgeon: Leanora Cover, MD;  Location: [place];  Service: Orthopedics;  Laterality: Right;     OB History   No obstetric history on file.      Home Medications    Prior to Admission medications   Medication Sig Start Date End Date Taking? Authorizing Provider  aspirin EC 81 MG tablet Take 81 mg by mouth daily.    [provider]  chlorthalidone (HYGROTON) 25 MG tablet Take 1 tablet (25 mg total) by mouth daily. Please make yearly appt with Dr. Radford Pax for September before anymore refills. 1st attempt 11/05/17   Sueanne Margarita, MD  FLUoxetine (PROZAC) 20 MG capsule Take 20 mg by mouth daily.    [provider]  Glucosamine-Chondroitin (GLUCOSAMINE CHONDR COMPLEX PO) Take 2 tablets by mouth daily.    [provider]  losartan (COZAAR) 100 MG tablet Take 100 mg by mouth daily.    [provider]  Multiple Vitamin (MULTIVITAMIN WITH MINERALS) TABS tablet Take 1 tablet by mouth daily.    [provider]  naproxen (NAPROSYN) 500 MG tablet Take 1 tablet (500 mg total) by mouth 2 (two) times daily with a meal. 09/30/18   Sanjuana Kava, MD  simvastatin (ZOCOR) 40 MG tablet Take 40 mg by mouth daily.    [provider]  VITAMIN D, CHOLECALCIFEROL, PO Take 1 tablet by  mouth daily.    [provider]    Family History Family History  Problem Relation Age of Onset  . Arthritis Mother   . Heart disease Mother   . Arthritis Father   . Heart disease Father   . CVA Father   . CAD Father        multi vessel  . Heart disease Brother   . Colon cancer Neg Hx     Social History Social History   Tobacco Use  . Smoking status: Never Smoker  . Smokeless tobacco: Never Used  Substance Use Topics  . Alcohol use: Yes    Alcohol/week: 3.0 standard drinks    Types: 3 Glasses of wine per week    Comment: 3-4 per week  . Drug use: No  lives with spouse   Allergies   Flagyl [metronidazole] and Benadryl  [diphenhydramine hcl]   Review of Systems Review of Systems  All other systems reviewed and are negative.    Physical Exam Updated Vital Signs BP (!) 200/83 (BP Location: Right Arm)   Pulse (!) 57   Temp (!) 97.4 F (36.3 C) (Oral)   Resp 17   Ht 5\' 2"  (1.575 m)   Wt 60.3 kg   SpO2 100%   BMI 24.33 kg/m   Vital signs normal except for hypertension   Physical Exam Vitals signs and nursing note reviewed.  Constitutional:      General: She is not in acute distress.    Appearance: Normal appearance. She is well-developed. She is not ill-appearing or toxic-appearing.  HENT:     Head: Normocephalic and atraumatic.     Right Ear: External ear normal.     Left Ear: Tympanic membrane, ear canal and external ear normal. There is no impacted cerumen.     Nose: Nose normal. No mucosal edema or rhinorrhea.     Mouth/Throat:     Mouth: Mucous membranes are dry.     Dentition: No dental abscesses.     Pharynx: No oropharyngeal exudate, posterior oropharyngeal erythema or uvula swelling.  Eyes:     Extraocular Movements: Extraocular movements intact.     Conjunctiva/sclera: Conjunctivae normal.     Pupils: Pupils are equal, round, and reactive to light.     Comments: No nystagmus  Neck:     Musculoskeletal: Full passive range of motion without pain, normal range of motion and neck supple.  Cardiovascular:     Rate and Rhythm: Normal rate and regular rhythm.     Heart sounds: Normal heart sounds. No murmur. No friction rub. No gallop.   Pulmonary:     Effort: Pulmonary effort is normal. No respiratory distress.     Breath sounds: Normal breath sounds. No wheezing, rhonchi or rales.  Chest:     Chest wall: No tenderness or crepitus.  Musculoskeletal: Normal range of motion.        General: No tenderness.     Comments: Moves all extremities well.   Skin:    General: Skin is warm and dry.     Coloration: Skin is not pale.     Findings: No erythema or rash.  Neurological:      General: No focal deficit present.     Mental Status: She is alert and oriented to person, place, and time.     Cranial Nerves: No cranial nerve deficit.     Comments: No pronator drift, grips are equal bilaterally, no motor weakness noted.  Psychiatric:  Mood and Affect: Mood normal. Mood is not anxious.        Speech: Speech normal.        Behavior: Behavior normal.        Thought Content: Thought content normal.      ED Treatments / Results  Labs (all labs ordered are listed, but only abnormal results are displayed) Results for orders placed or performed during the hospital encounter of 10/16/18  CBC with Differential  Result Value Ref Range   WBC 6.6 4.0 - 10.5 K/uL   RBC 4.57 3.87 - 5.11 MIL/uL   Hemoglobin 14.1 12.0 - 15.0 g/dL   HCT 40.5 36.0 - 46.0 %   MCV 88.6 80.0 - 100.0 fL   MCH 30.9 26.0 - 34.0 pg   MCHC 34.8 30.0 - 36.0 g/dL   RDW 11.9 11.5 - 15.5 %   Platelets 290 150 - 400 K/uL   nRBC 0.3 (H) 0.0 - 0.2 %   Neutrophils Relative % 64 %   Neutro Abs 4.3 1.7 - 7.7 K/uL   Lymphocytes Relative 24 %   Lymphs Abs 1.6 0.7 - 4.0 K/uL   Monocytes Relative 9 %   Monocytes Absolute 0.6 0.1 - 1.0 K/uL   Eosinophils Relative 1 %   Eosinophils Absolute 0.1 0.0 - 0.5 K/uL   Basophils Relative 1 %   Basophils Absolute 0.0 0.0 - 0.1 K/uL   Immature Granulocytes 1 %   Abs Immature Granulocytes 0.03 0.00 - 0.07 K/uL   Laboratory interpretation all normal except     EKG None  Radiology No results found.  Procedures Procedures (including critical care time)  Medications Ordered in ED Medications  ondansetron (ZOFRAN) injection 4 mg (has no administration in time range)  meclizine (ANTIVERT) tablet 25 mg (has no administration in time range)  LORazepam (ATIVAN) injection 1 mg (has no administration in time range)     Initial Impression / Assessment and Plan / ED Course  I have reviewed the triage vital signs and the nursing notes.  Pertinent labs &  imaging results that were available during my care of the patient were reviewed by me and considered in my medical decision making (see chart for details).   Patient was given IV fluids, blood work was done, CT of the head was done, MRI is not available here until August 3.   Patient was turned over at change of shift to Dr. Sedonia Small  Final Clinical Impressions(s) / ED Diagnoses   Final diagnoses:  Vertigo     Disposition pending  Rolland Porter, MD, Barbette Or, MD 10/16/18 [phone]

## 2018-10-16 NOTE — ED Triage Notes (Signed)
Pt woke up about 4am this morning and was extremely dizzy upon awaking. Pt has vomiting several times since she first became dizzy.

## 2018-10-16 NOTE — ED Notes (Signed)
Called [facility] with bed assignment and for transportation to [facility].

## 2018-10-16 NOTE — ED Notes (Signed)
Hospitalist Dr. Carles Collet at bedside at this time.

## 2018-10-16 NOTE — ED Provider Notes (Addendum)
  Provider Note MRN:  [id number]  Arrival date & time: 10/16/18    ED Course and Medical Decision Making  Assumed care from Dr. Tomi Bamberger at shift change.  Labs reveal profound hypokalemia at 2.4.  Patient continues to have vertiginous symptoms, explains that when she is laying flat and very still and looking to the right she does not have symptoms.  She continues to have a normal neurological exam.  This is more suggestive of peripheral vertigo, and her hypokalemia could also be contributing to her discomfort.  Hospital service has agreed to admit, I made them aware that we do not have MRI capabilities here at [place] and so, if she does not respond to medications or maneuvers and MRI is desired to exclude central cause of vertigo, admission and transfer to [facility] would be preferable.  Hospitalist agrees with this plan and patient is to be admitted to the [facility] service.   EKG Interpretation  Date/Time:  Saturday October 16 2018 09:28:35 EDT Ventricular Rate:  62 PR Interval:    QRS Duration: 121 QT Interval:  483 QTC Calculation: 491 R Axis:   76 Text Interpretation:  Sinus rhythm Nonspecific intraventricular conduction delay Minimal ST depression, diffuse leads Confirmed by Gerlene Fee [phone]) on 10/16/2018 10:16:13 AM       Critical Care Documentation Critical care time provided by me (excluding procedures): 34 minutes  Condition necessitating critical care: Profound hypokalemia  Components of critical care management: reviewing of prior records, laboratory and imaging interpretation, frequent re-examination and reassessment of vital signs, administration of IV potassium, p.o. potassium, discussion with consulting services.   Final Clinical Impressions(s) / ED Diagnoses     ICD-10-CM   1. Vertigo  R42   2. Hypokalemia  E87.6     ED Discharge Orders    None       Barth Kirks. Sedonia Small, Ruby  [email or web address]    Maudie Flakes, MD 10/16/18 4270    Maudie Flakes, MD 10/16/18 1016

## 2018-10-17 DIAGNOSIS — E876 Hypokalemia: Secondary | ICD-10-CM | POA: Diagnosis not present

## 2018-10-17 DIAGNOSIS — R42 Dizziness and giddiness: Secondary | ICD-10-CM | POA: Diagnosis not present

## 2018-10-17 LAB — BASIC METABOLIC PANEL
Anion gap: 9 (ref 5–15)
BUN: 9 mg/dL (ref 8–23)
CO2: 24 mmol/L (ref 22–32)
Calcium: 9.2 mg/dL (ref 8.9–10.3)
Chloride: 103 mmol/L (ref 98–111)
Creatinine, Ser: 0.59 mg/dL (ref 0.44–1.00)
GFR calc Af Amer: >60 mL/min (ref >60)
GFR calc non Af Amer: >60 mL/min (ref >60)
Glucose, Bld: 113 mg/dL — ABNORMAL HIGH (ref 70–99)
Potassium: 4 mmol/L (ref 3.5–5.1)
Sodium: 136 mmol/L (ref 135–145)

## 2018-10-17 LAB — HIV ANTIBODY (ROUTINE TESTING W REFLEX): HIV Screen 4th Generation wRfx: NONREACTIVE

## 2018-10-17 MED ORDER — DIAZEPAM 2 MG PO TABS: 1.0000 mg | ORAL_TABLET | Freq: Two times a day (BID) | ORAL | 0 refills | Status: DC | PRN

## 2018-10-17 MED ORDER — ONDANSETRON HCL 4 MG PO TABS: 4.0000 mg | ORAL_TABLET | Freq: Four times a day (QID) | ORAL | 0 refills | Status: DC | PRN

## 2018-10-17 MED ORDER — DIAZEPAM 2 MG PO TABS: 2.0000 mg | ORAL_TABLET | Freq: Two times a day (BID) | ORAL | Status: DC | PRN

## 2018-10-17 MED ORDER — DIAZEPAM 2 MG PO TABS
1.0000 mg | ORAL_TABLET | Freq: Two times a day (BID) | ORAL | Status: DC | PRN
  Administered 2018-10-17: 12:00:00 1 mg via ORAL
  Filled 2018-10-17: qty 1

## 2018-10-17 MED ORDER — MECLIZINE HCL 25 MG PO TABS: 25.0000 mg | ORAL_TABLET | Freq: Three times a day (TID) | ORAL | 0 refills | Status: DC | PRN

## 2018-10-17 MED ORDER — POLYETHYLENE GLYCOL 3350 17 G PO PACK
17.0000 g | PACK | Freq: Every day | ORAL | Status: DC
  Administered 2018-10-17: 17 g via ORAL
  Filled 2018-10-17: qty 1

## 2018-10-17 NOTE — Discharge Summary (Signed)
Physician Discharge Summary  ANJELICA GORNIAK QIO:[id number] DOB: 1956-01-09 DOA: 10/16/2018  PCP: Celene Squibb, MD  Admit date: 10/16/2018 Discharge date: 10/17/2018  Admitted From: home Discharge disposition: home   Recommendations for Outpatient Follow-Up:   1. Outpatient vestibular PT   Discharge Diagnosis:   Active Problems:   Benign essential HTN   Hyperlipidemia   Vertigo   Hypokalemia    Discharge Condition: Improved.  Diet recommendation: Low sodium, heart healthy  Wound care: None.  Code status: Full.   History of Present Illness:   Elizabeth Li is a 63 y.o. female with medical history of hypertension, hyperlipidemia, depression presenting with vertigo of acute onset when she woke up at 4 AM on 10/16/2018.  The patient had been in her usual state of health prior to the development of the above symptoms.  She has not had any dizziness or vertigo type symptoms with rotation of her neck/head prior to this morning.  She had denied any previous fevers, chills, viral prodrome, headache, chest pain, shortness breath, nausea, vomiting, diarrhea, abdominal pain, dysuria.  The patient states that her only new medication is omeprazole which she started 2 weeks prior to this admission.  She denies any new over-the-counter supplements or medications.  She denies any recent sick contacts.  The patient had associated nausea and vomiting x8 with her vertigo.  She was unsteady on her feet and required assistance of her spouse to get to the bathroom.  Because of her persistent symptoms, the patient presented for further evaluation.  She denied any actual headache, visual disturbance, focal extremity weakness, dysesthesia, dysarthria, or dysphasia. In the emergency department, the patient was afebrile hemodynamically stable saturating 100% room air.  LFTs and CBC were unremarkable.  BMP showed a potassium of 2.4.  CT of the brain was negative.  Due to concerns for posterior  circulation stroke, the patient is being transferred to [facility] for MRI brain.   Hospital Course by Problem:   Vertigo -MRI brain negative -suspect BPPV -PT evaluation -Antivert/valium as needed -outpatient vestibular PT  Hypokalemia -Presented with potassium 2.4 -Given additional 40 mEq potassium in addition to the 70 given in the emergency department -magnesium ok -Likely due to the patient's chlorthalidone and vomiting -Hold chlorthalidone   Essential hypertension -Continue losartan -d/c Chlorthalidone  Hyperlipidemia -Continue simvastatin  Depression/anxiety -Continue fluoxetine    Medical Consultants:      Discharge Exam:   Vitals:   10/17/18 0809 10/17/18 1100  BP: (!) 149/70 140/70  Pulse: 64   Resp: 16   Temp: 98.5 F (36.9 C) 98.6 F (37 C)  SpO2: 96% 98%   Vitals:   10/16/18 2337 10/17/18 0332 10/17/18 0809 10/17/18 1100  BP: (!) 159/74 (!) 155/65 (!) 149/70 140/70  Pulse: 61 61 64   Resp: 16 14 16    Temp: 97.9 F (36.6 C) 98.1 F (36.7 C) 98.5 F (36.9 C) 98.6 F (37 C)  TempSrc: Oral Oral Oral Oral  SpO2: 98% 97% 96% 98%  Weight:      Height:        General exam: Appears calm and comfortable.  The results of significant diagnostics from this hospitalization (including imaging, microbiology, ancillary and laboratory) are listed below for reference.     Procedures and Diagnostic Studies:   Ct Head Wo Contrast  Result Date: 10/16/2018 CLINICAL DATA:  Dizziness this morning EXAM: CT HEAD WITHOUT CONTRAST TECHNIQUE: Contiguous axial images were obtained from the base of the  skull through the vertex without intravenous contrast. COMPARISON:  None. FINDINGS: Brain: No evidence of acute infarction, hemorrhage, hydrocephalus, extra-axial collection or mass lesion/mass effect. There is mild chronic diffuse atrophy. Vascular: No hyperdense vessel noted. Skull: Normal. Negative for fracture or focal lesion. Sinuses/Orbits: No acute  finding. Other: None IMPRESSION: No focal acute intracranial abnormality identified. Electronically Signed   By: Abelardo Diesel M.D.   On: 10/16/2018 08:23   Mr Brain Wo Contrast  Result Date: 10/16/2018 CLINICAL DATA:  Dizziness upon wakening today. Vomiting. Negative head CT. EXAM: MRI HEAD WITHOUT CONTRAST TECHNIQUE: Multiplanar, multiecho pulse sequences of the brain and surrounding structures were obtained without intravenous contrast. COMPARISON:  Head CT same day FINDINGS: Brain: The brain has a normal appearance without evidence of malformation, atrophy, old or acute small or large vessel infarction, mass lesion, hemorrhage, hydrocephalus or extra-axial collection. Vascular: Major vessels at the base of the brain show flow. Venous sinuses appear patent. Skull and upper cervical spine: Normal. Sinuses/Orbits: Clear/normal. Other: None significant. IMPRESSION: Normal examination. No abnormality seen to explain the presenting symptoms. Electronically Signed   By: Nelson Chimes M.D.   On: 10/16/2018 21:33     Labs:   Basic Metabolic Panel: Recent Labs  Lab 10/16/18 0638 10/16/18 1041 10/17/18 0821  NA 137  --  136  K 2.4*  --  4.0  CL 99  --  103  CO2 26  --  24  GLUCOSE 133*  --  113*  BUN 20  --  9  CREATININE 0.51  --  0.59  CALCIUM 9.1  --  9.2  MG 2.2 2.2  --    GFR Estimated Creatinine Clearance: 60.8 mL/min (by C-G formula based on SCr of 0.59 mg/dL). Liver Function Tests: Recent Labs  Lab 10/16/18 0638  AST 22  ALT 27  ALKPHOS 65  BILITOT 0.7  PROT 6.8  ALBUMIN 4.1   No results for input(s): LIPASE, AMYLASE in the last 168 hours. No results for input(s): AMMONIA in the last 168 hours. Coagulation profile No results for input(s): INR, PROTIME in the last 168 hours.  CBC: Recent Labs  Lab 10/16/18 0638  WBC 6.6  NEUTROABS 4.3  HGB 14.1  HCT 40.5  MCV 88.6  PLT 290   Cardiac Enzymes: No results for input(s): CKTOTAL, CKMB, CKMBINDEX, TROPONINI in the last  168 hours. BNP: Invalid input(s): POCBNP CBG: No results for input(s): GLUCAP in the last 168 hours. D-Dimer No results for input(s): DDIMER in the last 72 hours. Hgb A1c Recent Labs    10/16/18 1041  HGBA1C 5.7*   Lipid Profile No results for input(s): CHOL, HDL, LDLCALC, TRIG, CHOLHDL, LDLDIRECT in the last 72 hours. Thyroid function studies No results for input(s): TSH, T4TOTAL, T3FREE, THYROIDAB in the last 72 hours.  Invalid input(s): FREET3 Anemia work up No results for input(s): VITAMINB12, FOLATE, FERRITIN, TIBC, IRON, RETICCTPCT in the last 72 hours. Microbiology Recent Results (from the past 240 hour(s))  SARS Coronavirus 2 [facility] order, Performed in [facility] hospital lab) Nasopharyngeal Nasopharyngeal Swab     Status: None   Collection Time: 10/16/18  9:45 AM   Specimen: Nasopharyngeal Swab  Result Value Ref Range Status   SARS Coronavirus 2 NEGATIVE NEGATIVE Final    Comment: (NOTE) If result is NEGATIVE SARS-CoV-2 target nucleic acids are NOT DETECTED. The SARS-CoV-2 RNA is generally detectable in upper and lower  respiratory specimens during the acute phase of infection. The lowest  concentration of SARS-CoV-2 viral copies  this assay can detect is 250  copies / mL. A negative result does not preclude SARS-CoV-2 infection  and should not be used as the sole basis for treatment or other  patient management decisions.  A negative result may occur with  improper specimen collection / handling, submission of specimen other  than nasopharyngeal swab, presence of viral mutation(s) within the  areas targeted by this assay, and inadequate number of viral copies  (<250 copies / mL). A negative result must be combined with clinical  observations, patient history, and epidemiological information. If result is POSITIVE SARS-CoV-2 target nucleic acids are DETECTED. The SARS-CoV-2 RNA is generally detectable in upper and lower  respiratory specimens dur ing the  acute phase of infection.  Positive  results are indicative of active infection with SARS-CoV-2.  Clinical  correlation with patient history and other diagnostic information is  necessary to determine patient infection status.  Positive results do  not rule out bacterial infection or co-infection with other viruses. If result is PRESUMPTIVE POSTIVE SARS-CoV-2 nucleic acids MAY BE PRESENT.   A presumptive positive result was obtained on the submitted specimen  and confirmed on repeat testing.  While 2019 novel coronavirus  (SARS-CoV-2) nucleic acids may be present in the submitted sample  additional confirmatory testing may be necessary for epidemiological  and / or clinical management purposes  to differentiate between  SARS-CoV-2 and other Sarbecovirus currently known to infect humans.  If clinically indicated additional testing with an alternate test  methodology [phone]) is advised. The SARS-CoV-2 RNA is generally  detectable in upper and lower respiratory sp ecimens during the acute  phase of infection. The expected result is Negative. Fact Sheet for Patients:  StrictlyIdeas.no Fact Sheet for Healthcare Providers: BankingDealers.co.za This test is not yet approved or cleared by the [place] FDA and has been authorized for detection and/or diagnosis of SARS-CoV-2 by FDA under an Emergency Use Authorization (EUA).  This EUA will remain in effect (meaning this test can be used) for the duration of the COVID-19 declaration under Section 564(b)(1) of the Act, 21 U.S.C. section 360bbb-3(b)(1), unless the authorization is terminated or revoked sooner. Performed at [facility], [address] [place], [place] [address]      Discharge Instructions:   Discharge Instructions    Ambulatory referral to Physical Therapy   Complete by: As directed    Vestibular PT   Diet - low sodium heart healthy   Complete by: As directed     Discharge instructions   Complete by: As directed    Outpatient vestibular PT   Increase activity slowly   Complete by: As directed      Allergies as of 10/17/2018      Reactions   Flagyl [metronidazole] Other (See Comments)   Causes weakness and dizziness       Medication List    STOP taking these medications   chlorthalidone 25 MG tablet Commonly known as: HYGROTON   zolpidem 5 MG tablet Commonly known as: AMBIEN     TAKE these medications   aspirin EC 81 MG tablet Take 81 mg by mouth daily.   diazepam 2 MG tablet Commonly known as: VALIUM Take 0.5 tablets (1 mg total) by mouth every 12 (twelve) hours as needed (vertigo if meclizine does not work).   FLUoxetine 20 MG capsule Commonly known as: PROZAC Take 20 mg by mouth daily.   GLUCOSAMINE CHONDR COMPLEX PO Take 2 tablets by mouth daily.   ketoconazole 2 % shampoo Commonly  known as: NIZORAL Apply 1 application topically daily as needed.   losartan 100 MG tablet Commonly known as: COZAAR Take 100 mg by mouth daily.   meclizine 25 MG tablet Commonly known as: ANTIVERT Take 1 tablet (25 mg total) by mouth 3 (three) times daily as needed for dizziness.   multivitamin with minerals Tabs tablet Take 1 tablet by mouth daily.   omeprazole 20 MG capsule Commonly known as: PRILOSEC Take 1 capsule by mouth daily.   ondansetron 4 MG tablet Commonly known as: ZOFRAN Take 1 tablet (4 mg total) by mouth every 6 (six) hours as needed for nausea.   simvastatin 40 MG tablet Commonly known as: ZOCOR Take 40 mg by mouth daily.   VITAMIN D (CHOLECALCIFEROL) PO Take 1 tablet by mouth daily.      Follow-up Information    Celene Squibb, MD Follow up in 1 week(s).   Specialty: Internal Medicine Contact information: [place] [address] [phone]        [facility] Follow [facility].   Specialty: Rehabilitation Why: They will call you this week to  schedule vestibular physical therapy to help with your vertigo. Contact information: [address] [place] [id number] [place] [address] [phone]           Time coordinating discharge: 25 min  Signed:  Geradine Girt DO  [facility] 10/17/2018, 1:25 PM

## 2018-10-17 NOTE — Evaluation (Signed)
Physical Therapy Evaluation Patient Details Name: Elizabeth Li MRN: [id number] DOB: [birth year] Today's Date: 10/17/2018   History of Present Illness  63 y.o. female with medical history of hypertension, hyperlipidemia, arthritis,  depression presenting with vertigo of acute onset when she woke up at 4 AM on 10/16/2018.  +N/V; CT and MRI brain negative. Hypokalemia  Clinical Impression  Pt presented with above symptoms. Patient with symptoms overlapping for left posterior canalithiasis (BPPV) and vestibular neuritis. Completed left Epley with instructions for post-maneuver activity given (handout and verbally). Educated on role of PT with vertigo and possible need for OPPT. Patient has been mobilizing well with supervision from nursing (to/from bathroom). Anticipate patient will be safe to d/c home later today. Pt currently with functional limitations due to the deficits listed below (see PT Problem List).  Pt will benefit from skilled PT to increase their independence and safety with mobility to allow discharge to the venue listed below.       Follow Up Recommendations Outpatient PT(for vestibular rehab)    Equipment Recommendations  None recommended by PT    Recommendations for Other Services       Precautions / Restrictions Precautions Precautions: Fall     10/17/18 0906  Symptom Behavior  Subjective history of current problem better than yesterday; I have to move slow and focus on one object   Type of Dizziness  Spinning  Frequency of Dizziness with each movement  Duration of Dizziness seconds  Symptom Nature Motion provoked  Aggravating Factors Activity in general;Sit to stand  Relieving Factors Slow movements (focusing on an object)  Progression of Symptoms Better  History of similar episodes none  Oculomotor Exam  Oculomotor Alignment Normal  Ocular ROM normal  Spontaneous Absent  Gaze-induced  Left beating nystagmus with L gaze (with rotational component)  Auditory   Comments reports fullness left ear; MD told her she had fluid in her left ear  Positional Testing  Dix-Hallpike Dix-Hallpike Right;Dix-Hallpike Left  Dix-Hallpike Right  Dix-Hallpike Right Duration 0  Dix-Hallpike Right Symptoms No nystagmus  Dix-Hallpike Left  Dix-Hallpike Left Duration 60  Dix-Hallpike Left Symptoms Upbeat, left rotatory nystagmus      10/17/18 0001  Vestibular Treatment/Exercise  Vestibular Treatment Provided Canalith Repositioning  Canalith Repositioning Epley Manuever Left   EPLEY MANUEVER LEFT  Number of Reps  1  Overall Response   (became very nauseated; did not repeat)    Mobility  Bed Mobility Overal bed mobility: Independent                Transfers                 General transfer comment: reports no assist needed; does call for nursing to be present due to continued vertigo  Ambulation/Gait             General Gait Details: reports ambulating without imbalance with focusing on single target  Stairs            Wheelchair Mobility    Modified Rankin (Stroke Patients Only)       Balance                                             Pertinent Vitals/Pain Pain Assessment: No/denies pain    Home Living Family/patient expects to be discharged to:: Private residence Living Arrangements: Spouse/significant other Available Help at Discharge: Family;Available  24 hours/day Type of Home: House Home Access: Stairs to enter Entrance Stairs-Rails: Psychiatric nurse of Steps: 2 Home Layout: One level Home Equipment: Walker - 2 wheels;Shower seat;Cane - single point      Prior Function Level of Independence: Independent         Comments: works in ED registration at [employer]        Extremity/Trunk Assessment                Communication   Communication: No difficulties  Cognition Arousal/Alertness: Awake/alert Behavior During Therapy: WFL for  tasks assessed/performed Overall Cognitive Status: Within Functional Limits for tasks assessed                                        General Comments General comments (skin integrity, edema, etc.): provided handouts on BPPV and neuritis; educated on visual fixation to assist with symptom management; educated on role of OPPT    Exercises     Assessment/Plan    PT Assessment Patient needs continued PT services  PT Problem List Decreased activity tolerance;Decreased mobility;Decreased knowledge of precautions       PT Treatment Interventions Gait training;Functional mobility training;Therapeutic activities;Therapeutic exercise;Neuromuscular re-education;Patient/family education    PT Goals (Current goals can be found in the Care Plan section)  Acute Rehab PT Goals Patient Stated Goal: go home  PT Goal Formulation: With patient Time For Goal Achievement: 10/24/18 Potential to Achieve Goals: Good    Frequency Min 4X/week   Barriers to discharge        Co-evaluation               AM-PAC PT "6 Clicks" Mobility  Outcome Measure Help needed turning from your back to your side while in a flat bed without using bedrails?: None Help needed moving from lying on your back to sitting on the side of a flat bed without using bedrails?: None Help needed moving to and from a bed to a chair (including a wheelchair)?: A Little Help needed standing up from a chair using your arms (e.g., wheelchair or bedside chair)?: A Little Help needed to walk in hospital room?: A Little Help needed climbing 3-5 steps with a railing? : A Little 6 Click Score: 20    End of Session   Activity Tolerance: Treatment limited secondary to medical complications (Comment)(increased nausea) Patient left: in bed;with call bell/phone within reach;with bed alarm set   PT Visit Diagnosis: BPPV;Dizziness and giddiness (R42) BPPV - Right/Left : Left    Time: 6387-5643 PT Time Calculation  (min) (ACUTE ONLY): 50 min   Charges:   PT Evaluation $PT Eval Low Complexity: 1 Low PT Treatments $Self Care/Home Management: 8-22 $Canalith Rep Proc: 8-22 mins          Barry Brunner, PT      [place] [facility] Toshika Parrow 10/17/2018, 10:05 AM

## 2018-10-17 NOTE — Plan of Care (Signed)

## 2018-10-17 NOTE — Care Management (Signed)
Referral placed through Epic for vestibular PT at [facility]. Added to AVS

## 2018-10-19 LAB — URINE CULTURE: Culture: >=100000 — AB

## 2018-10-20 ENCOUNTER — Telehealth (HOSPITAL_COMMUNITY): Payer: Self-pay | Admitting: Internal Medicine

## 2018-10-20 NOTE — Telephone Encounter (Signed)
10/20/18  pt left a message to cx she said that she went to her family dr and said that [facility] gave her the wrong diagnosis and has been prescribed medicatio

## 2018-10-22 ENCOUNTER — Ambulatory Visit (HOSPITAL_COMMUNITY): Payer: No Typology Code available for payment source

## 2018-10-22 ENCOUNTER — Ambulatory Visit (HOSPITAL_COMMUNITY): Payer: No Typology Code available for payment source | Admitting: Physical Therapy

## 2018-10-26 ENCOUNTER — Ambulatory Visit (HOSPITAL_COMMUNITY): Payer: No Typology Code available for payment source | Admitting: Physical Therapy

## 2018-10-29 ENCOUNTER — Ambulatory Visit (HOSPITAL_COMMUNITY): Payer: No Typology Code available for payment source | Admitting: Physical Therapy

## 2018-11-01 ENCOUNTER — Encounter (HOSPITAL_COMMUNITY): Payer: No Typology Code available for payment source | Admitting: Physical Therapy

## 2018-11-03 ENCOUNTER — Encounter (HOSPITAL_COMMUNITY): Payer: No Typology Code available for payment source | Admitting: Physical Therapy

## 2018-11-08 ENCOUNTER — Encounter (HOSPITAL_COMMUNITY): Payer: No Typology Code available for payment source | Admitting: Physical Therapy

## 2018-11-10 ENCOUNTER — Encounter (HOSPITAL_COMMUNITY): Payer: No Typology Code available for payment source | Admitting: Physical Therapy

## 2018-11-11 ENCOUNTER — Other Ambulatory Visit: Payer: Self-pay | Admitting: *Deleted

## 2018-11-11 NOTE — Patient Outreach (Signed)
[facility]) Care Management  11/11/2018  Elizabeth Li [birth year] [id number]   Transition of care telephone call  Referral received:11/11/18 Initial outreach: 11/11/18 Insurance: [place]  Initial unsuccessful telephone call to patient's preferred (home) number in order to complete transition of care assessment; no answer, left HIPAA compliant voicemail message requesting return call.   Objective: Per the electronic medical record, Elizabeth Li  was hospitalized at [facility] from 8/1-10/17/18 with vertigo and low potassium.  Comorbidities include: HTN, diverticulitis, arthritis, hyperlipidemia She was discharged to home on 10/17/18 without the need for home health services or durable medical equipment per the discharge summary. She was referred to the [facility] for outpatient vestibular PT.  Plan: This RNCM will route unsuccessful outreach letter with [facility] Management pamphlet and 24 hour Nurse Advice Line Magnet to [facility] clinical pool to be mailed to patient's home address. This RNCM will attempt another outreach within 4 business days.  Barrington Ellison RN,CCM,CDE [place] Management Coordinator Office Phone [phone] Office Fax [phone]

## 2018-11-16 ENCOUNTER — Encounter: Payer: Self-pay | Admitting: *Deleted

## 2018-11-16 ENCOUNTER — Ambulatory Visit: Payer: Self-pay | Admitting: *Deleted

## 2018-11-16 ENCOUNTER — Other Ambulatory Visit: Payer: Self-pay | Admitting: *Deleted

## 2018-11-16 ENCOUNTER — Encounter (HOSPITAL_COMMUNITY): Payer: No Typology Code available for payment source | Admitting: Physical Therapy

## 2018-11-16 NOTE — Patient Outreach (Signed)
[facility]) Care Management  11/16/2018  Elizabeth Li 1955/05/20 [id number]   Transition of care telephone call  Referral received:11/11/18 Initial outreach: 11/11/18 Insurance: [place]  Second unsuccessful telephone call to patient's preferred (home) number in order to complete transition of care assessment; no answer, left HIPAA compliant message requesting return call.   Objective: Per the electronic medical record, Mrs. Elizabeth Li  was hospitalized at [facility] from 8/1-10/17/18 with vertigo and low potassium.  Comorbidities include: HTN, diverticulitis, arthritis, hyperlipidemia She was discharged to home on 10/17/18 without the need for home health services or durable medical equipment per the discharge summary. She was referred to the [facility] for outpatient vestibular PT.  Plan: If no return call from patient, this RNCM will attempt a third and final outreach within 4 business days.  Barrington Ellison RN,CCM,CDE [place] Management Coordinator Office Phone [phone] Office Fax [phone]

## 2018-11-16 NOTE — Patient Outreach (Signed)
[facility]) Care Management  11/16/2018  Elizabeth Li 12-13-1955 [id number]  Transition of care call/case closure   Referral received: 11/11/18 Initial outreach: 11/11/18 Insurance: [place] [employer]   Subjective: This RNCM returned call to patient after she left message earlier today in response to message left for her by this RNCM.  2 HIPAA identifiers verified. Explained purpose of call and completed transition of care assessment.  Elizabeth Li states she is doing well, says she saw her EENT MD and she received treatment for her vertigo. She says she did not need the referral to the OP rehabilitation clinic for vestibular therapy since her symptoms have subsided.  She says she does not have the hospital indemnity She says she uses a [facility] outpatient pharmacy.   Objective: Per the electronic medical record, Elizabeth Li  was hospitalized at [facility] from 8/1-10/17/18 with vertigo and low potassium.  Comorbidities include: HTN, diverticulitis, arthritis, hyperlipidemia She was discharged to home on 10/17/18 without the need for home health services or durable medical equipment per the discharge summary. She was referred to the [facility] for outpatient vestibular PT.    Assessment:  Patient voices good understanding of all discharge instructions.  See transition of care flowsheet for assessment details.   Plan:  Reviewed hospital discharge diagnosis of vertigo and treatment plan using hospital discharge instructions, assessing medication adherence, reviewing problems requiring provider notification, and discussing the importance of follow up with  primary care provider and/or specialists as directed. No ongoing care management needs identified so will close case to [facility] services.  Barrington Ellison RN,CCM,CDE [place] Management Coordinator Office Phone  [phone] Office Fax [phone]

## 2018-11-17 ENCOUNTER — Encounter: Payer: Self-pay | Admitting: Orthopedic Surgery

## 2018-11-17 ENCOUNTER — Other Ambulatory Visit: Payer: Self-pay

## 2018-11-17 ENCOUNTER — Ambulatory Visit (INDEPENDENT_AMBULATORY_CARE_PROVIDER_SITE_OTHER): Payer: No Typology Code available for payment source | Admitting: Orthopedic Surgery

## 2018-11-17 DIAGNOSIS — M19012 Primary osteoarthritis, left shoulder: Secondary | ICD-10-CM

## 2018-11-17 MED ORDER — BUPIVACAINE HCL 0.5 % IJ SOLN
9.0000 mL | INTRAMUSCULAR | Status: AC | PRN
  Administered 2018-11-17: 9 mL via INTRA_ARTICULAR

## 2018-11-17 MED ORDER — LIDOCAINE HCL 1 % IJ SOLN
5.0000 mL | INTRAMUSCULAR | Status: AC | PRN
  Administered 2018-11-17: 5 mL

## 2018-11-17 MED ORDER — METHYLPREDNISOLONE ACETATE 40 MG/ML IJ SUSP
40.0000 mg | INTRAMUSCULAR | Status: AC | PRN
  Administered 2018-11-17: 40 mg via INTRA_ARTICULAR

## 2018-11-17 NOTE — Progress Notes (Addendum)
Office Visit Note   Patient: Elizabeth Li           Date of Birth: 06-Oct-1955           MRN: [id number] Visit Date: 11/17/2018 Requested by: Celene Squibb, MD [place],  [place] [address] PCP: Celene Squibb, MD  Subjective: Chief Complaint  Patient presents with  . Left Shoulder - Pain    HPI: English is a patient with known left shoulder arthritis.  She had an injection over 6 weeks ago and did well with that.  Pain is recurring to some degree.  She does have mild arthritis in the glenohumeral joint.  Rotator cuff intact.              ROS: All systems reviewed are negative as they relate to the chief complaint within the history of present illness.  Patient denies  fevers or chills.   Assessment & Plan: Visit Diagnoses:  1. Arthritis of left shoulder region     Plan: Impression is arthritis left shoulder.  Plan is left shoulder intra-articular joint injection.  Patient tolerated procedure well.  Follow-up with me as needed.  Could consider 1 more injection before the end of the year but do not want to go more than 2 to 3 injections/year in the joint.  Another option for her would potentially be fluoroscopically guided gel injection has an out-of-pocket expense.  Follow-Up Instructions: Return if symptoms worsen or fail to improve.   Orders:  No orders of the defined types were placed in this encounter.  No orders of the defined types were placed in this encounter.     Procedures: Large Joint Inj: L glenohumeral on 11/17/2018 9:03 PM Indications: diagnostic evaluation and pain Details: 18 G 1.5 in needle, posterior approach  Arthrogram: No  Medications: 9 mL bupivacaine 0.5 %; 40 mg methylPREDNISolone acetate 40 MG/ML; 5 mL lidocaine 1 % Outcome: tolerated well, no immediate complications Procedure, treatment alternatives, risks and benefits explained, specific risks discussed. Consent was given by the patient. Immediately prior to procedure a time out was  called to verify the correct patient, procedure, equipment, support staff and site/side marked as required. Patient was prepped and draped in the usual sterile fashion.       Clinical Data: No additional findings.  Objective: Vital Signs: There were no vitals taken for this visit.  Physical Exam:   Constitutional: Patient appears well-developed HEENT:  Head: Normocephalic Eyes:EOM are normal Neck: Normal range of motion Cardiovascular: Normal rate Pulmonary/chest: Effort normal Neurologic: Patient is alert Skin: Skin is warm Psychiatric: Patient has normal mood and affect    Ortho Exam: Ortho exam demonstrates good rotator cuff strength infraspinatus supraspinatus and subscap muscle testing.  Has good EPL FPL interosseous function.  Passive range of motion is above 90 degrees of forward flexion and abduction.  No significant restriction of external rotation on the left compared to the right.  Specialty Comments:  No specialty comments available.  Imaging: No results found.   PMFS History: Patient Active Problem List   Diagnosis Date Noted  . Vertigo 10/16/2018  . Hypokalemia 10/16/2018  . Rectal bleeding 02/11/2017  . Chest pain 11/20/2016  . Benign essential HTN 11/20/2016  . Hyperlipidemia 11/20/2016  . Abnormal CT scan, colon   . Diverticulitis of colon 11/09/2014  . Abdominal bloating 11/09/2014   Past Medical History:  Diagnosis Date  . Arthritis   . Benign essential HTN 11/20/2016  . Chest pain  11/20/2016  . Diverticulitis   . History of frequent urinary tract infections   . Hypercholesterolemia   . Hyperlipidemia 11/20/2016  . Hypertension   . Wears glasses     Family History  Problem Relation Age of Onset  . Arthritis Mother   . Heart disease Mother   . Arthritis Father   . Heart disease Father   . CVA Father   . CAD Father        multi vessel  . Heart disease Brother   . Colon cancer Neg Hx     Past Surgical History:  Procedure Laterality  Date  . COLONOSCOPY  07/15/2008   polyps-done in [place]  . COLONOSCOPY N/A 11/24/2014   Dr. Gala Romney: colonic diverticulosis, melanosis coli, surveillance in 2021  . FOOT SURGERY    . LAPAROSCOPIC PARTIAL COLECTOMY N/A 10/08/2015   Procedure: LAPAROSCOPIC PARTIAL COLECTOMY;  Surgeon: Excell Seltzer, MD;  Location: [facility];  Service: General;  Laterality: N/A;  . TONSILLECTOMY    . TRIGGER FINGER RELEASE Right 02/27/2015   Procedure: RELEASE TRIGGER FINGER/A-1 PULLEY RIGHT LONG FINGER;  Surgeon: Leanora Cover, MD;  Location: [facility];  Service: Orthopedics;  Laterality: Right;   Social History   Occupational History  . Occupation: [employer]    Comment: Biomedical engineer  Tobacco Use  . Smoking status: Never Smoker  . Smokeless tobacco: Never Used  Substance and Sexual Activity  . Alcohol use: Yes    Alcohol/week: 3.0 standard drinks    Types: 3 Glasses of wine per week    Comment: 3-4 per week  . Drug use: No  . Sexual activity: Not on file

## 2018-11-18 ENCOUNTER — Encounter (HOSPITAL_COMMUNITY): Payer: No Typology Code available for payment source | Admitting: Physical Therapy

## 2018-11-19 ENCOUNTER — Ambulatory Visit: Payer: Self-pay | Admitting: *Deleted

## 2018-12-03 MED FILL — FLUoxetine HCL 20 MG CAPS: 20 | 90 days supply | Qty: 90 | Fill #0

## 2018-12-03 MED FILL — CHLORTHALIDONE 25 MG TABS: 25 | 90 days supply | Qty: 90 | Fill #0

## 2018-12-03 MED FILL — LOSARTAN POTASSIUM 100 MG T: 100 | 90 days supply | Qty: 90 | Fill #0

## 2019-02-07 ENCOUNTER — Ambulatory Visit (INDEPENDENT_AMBULATORY_CARE_PROVIDER_SITE_OTHER): Payer: No Typology Code available for payment source | Admitting: Neurology

## 2019-02-07 ENCOUNTER — Other Ambulatory Visit: Payer: Self-pay

## 2019-02-07 ENCOUNTER — Telehealth: Payer: Self-pay | Admitting: Neurology

## 2019-02-07 ENCOUNTER — Encounter: Payer: Self-pay | Admitting: Neurology

## 2019-02-07 VITALS — BP 136/75 | HR 75 | Temp 97.6°F | Ht 62.0 in | Wt 136.5 lb

## 2019-02-07 DIAGNOSIS — R0989 Other specified symptoms and signs involving the circulatory and respiratory systems: Secondary | ICD-10-CM | POA: Diagnosis not present

## 2019-02-07 DIAGNOSIS — H814 Vertigo of central origin: Secondary | ICD-10-CM | POA: Diagnosis not present

## 2019-02-07 NOTE — Telephone Encounter (Signed)
During checkout I advised that Dr. Jannifer Franklin would like her to f/u in 4 months. Pt did not wish to schedule a f/u stating she had "several test to have done and will consider after results". FYI

## 2019-02-07 NOTE — Progress Notes (Signed)
Reason for visit: Vertigo  Referring physician: Dr. Marvel Plan Elizabeth Li is a 63 y.o. female  History of present illness:  Elizabeth Li is a 64 year old right-handed white female with a history of vertigo that began around 16 October 2018.  The patient awakened at 4 AM in the morning with severe vertigo at that time, she had multiple episodes of nausea and vomiting, she was admitted to the hospital for this reason.  The patient claims that the nausea and vomiting lasted only 1 day, but she has had persistent problems with vertigo since that time.  She indicates that there is a definite positional component.  When she lies down in bed flat on her back or on her right side she feels normal, but she will have vertigo if she rolls to the left side.  In the morning, when she gets up out of bed, she will have some dizziness for about an hour to an hour and a half, but eventually the dizziness goes away.  If she moves her head quickly or bends or stoops, she may have brief vertigo.  For many years, she has had brief episodes of decreased hearing on the left side that may occur 8-10 times a week.  She may also have some clicking noises in the left ear at times.  The patient has not had formal audiometric testing in the past.  She does have occasional neck pain but she denies any pain in the ears or any headache associated with vertigo.  The patient reports no numbness or weakness of the face, arms, legs, she denies any falls or difficulty controlling the bowels or the bladder.  She has not had any loss of vision or double vision.  She underwent Epley maneuvers while in the hospital for 1 day, she has not had any further vestibular rehabilitation.  She was given a trial of Flomax without benefit, meclizine did not help her.  Past Medical History:  Diagnosis Date   Arthritis    Benign essential HTN 11/20/2016   Chest pain 11/20/2016   Diverticulitis    History of frequent urinary tract infections     Hypercholesterolemia    Hyperlipidemia 11/20/2016   Hypertension    Wears glasses     Past Surgical History:  Procedure Laterality Date   COLONOSCOPY  07/15/2008   polyps-done in [place] N/A 11/24/2014   Dr. Gala Romney: colonic diverticulosis, melanosis coli, surveillance in 2021   [place] N/A 10/08/2015   Procedure: LAPAROSCOPIC PARTIAL COLECTOMY;  Surgeon: Excell Seltzer, MD;  Location: [facility];  Service: General;  Laterality: N/A;   TONSILLECTOMY     TRIGGER FINGER RELEASE Right 02/27/2015   Procedure: RELEASE TRIGGER FINGER/A-1 PULLEY RIGHT LONG FINGER;  Surgeon: Leanora Cover, MD;  Location: [facility];  Service: Orthopedics;  Laterality: Right;    Family History  Problem Relation Age of Onset   Arthritis Mother    Heart disease Mother    Arthritis Father    Heart disease Father    CVA Father    CAD Father        multi vessel   Heart disease Brother    Colon cancer Neg Hx     Social history:  reports that she has never smoked. She has never used smokeless tobacco. She reports current alcohol use of about 3.0 standard drinks of alcohol per week. She reports that she does not use drugs.  Medications:  Prior to Admission medications   Medication Sig Start Date End Date Taking? Authorizing Provider  aspirin EC 81 MG tablet Take 81 mg by mouth daily.   Yes [provider]  chlorthalidone (HYGROTON) 25 MG tablet Take 25 mg by mouth daily.   Yes [provider]  FLUoxetine (PROZAC) 20 MG capsule Take 20 mg by mouth daily.   Yes [provider]  Glucosamine-Chondroitin (GLUCOSAMINE CHONDR COMPLEX PO) Take 2 tablets by mouth daily.   Yes [provider]  ketoconazole (NIZORAL) 2 % shampoo Apply 1 application topically daily as needed. 09/28/18  Yes [provider]  losartan (COZAAR) 100 MG tablet Take 100 mg by mouth daily.   Yes [provider]    Multiple Vitamin (MULTIVITAMIN WITH MINERALS) TABS tablet Take 1 tablet by mouth daily.   Yes [provider]  omeprazole (PRILOSEC) 20 MG capsule Take 1 capsule by mouth daily. 10/12/18  Yes [provider]  simvastatin (ZOCOR) 40 MG tablet Take 40 mg by mouth daily.   Yes [provider]  VITAMIN D, CHOLECALCIFEROL, PO Take 1 tablet by mouth daily.   Yes [provider]      Allergies  Allergen Reactions   Flagyl [Metronidazole] Other (See Comments)    Causes weakness and dizziness     ROS:  Out of a complete 14 system review of symptoms, the patient complains only of the following symptoms, and all other reviewed systems are negative.  Dizziness Birthmarks, moles  Blood pressure 136/75, pulse 75, temperature 97.6 F (36.4 C), height 5\' 2"  (1.575 m), weight 136 lb 8 oz (61.9 kg).  Physical Exam  General: The patient is alert and cooperative at the time of the examination.  Eyes: Pupils are equal, round, and reactive to light. Discs are flat bilaterally.  Ears: Tympanic membranes are clear bilaterally.  Neck: The neck is supple, a left carotid bruit is noted.  Respiratory: The respiratory examination is clear.  Cardiovascular: The cardiovascular examination reveals a regular rate and rhythm, no obvious murmurs or rubs are noted.  Skin: Extremities are without significant edema.  Neurologic Exam  Mental status: The patient is alert and oriented x 3 at the time of the examination. The patient has apparent normal recent and remote memory, with an apparently normal attention span and concentration ability.  Cranial nerves: Facial symmetry is present. There is good sensation of the face to pinprick and soft touch bilaterally. The strength of the facial muscles and the muscles to head turning and shoulder shrug are normal bilaterally. Speech is well enunciated, no aphasia or dysarthria is noted. Extraocular movements are full. Visual fields  are full. The tongue is midline, and the patient has symmetric elevation of the soft palate. No obvious hearing deficits are noted.  Motor: The motor testing reveals 5 over 5 strength of all 4 extremities. Good symmetric motor tone is noted throughout.  Sensory: Sensory testing is intact to pinprick, soft touch, vibration sensation, and position sense on all 4 extremities. No evidence of extinction is noted.  Coordination: Cerebellar testing reveals good finger-nose-finger and heel-to-shin bilaterally. The Nyan-Barrany procedure was done, the patient did develop subjective vertigo when head was turned to the left, the patient had bilateral rotatory and horizontal nystagmus when she noted onset of symptoms.  Gait and station: Gait is normal. Tandem gait is normal. Romberg is negative. No drift is seen.  Reflexes: Deep tendon reflexes are symmetric and normal bilaterally. Toes are downgoing bilaterally.  MRI brain 10/16/18:  IMPRESSION: Normal examination. No abnormality seen to explain the presenting Symptoms.  * MRI scan images were reviewed online. I agree with the written report.    Assessment/Plan:  1.  Positional vertigo  2.  Episodic hearing deficits, AS  3.  Left carotid bruit  The patient will be sent for a carotid Doppler to evaluate the left carotid bruit.  She will be sent back for MRI of the head with special attention to the internal auditory canal, with and without contrast.  The prior MRI did not include contrast.  Formal audiometric testing in the future may be of some benefit.  The patient will be sent for vestibular rehabilitation, she will follow-up here in 4 months.  Jill Alexanders MD 02/07/2019 9:32 AM  [facility] [address] [place], [place] [address]  Phone [phone] Fax [phone]

## 2019-02-14 ENCOUNTER — Ambulatory Visit (HOSPITAL_COMMUNITY)
Admission: RE | Admit: 2019-02-14 | Discharge: 2019-02-14 | Disposition: A | Discharge status: Home / Self Care | Payer: No Typology Code available for payment source | Source: Ambulatory Visit | Attending: Neurology | Admitting: Neurology

## 2019-02-14 ENCOUNTER — Other Ambulatory Visit: Payer: Self-pay

## 2019-02-14 DIAGNOSIS — R0989 Other specified symptoms and signs involving the circulatory and respiratory systems: Secondary | ICD-10-CM | POA: Insufficient documentation

## 2019-02-14 NOTE — Progress Notes (Signed)
Carotid duplex       has been completed. Preliminary results can be found under CV proc through chart review. Hema Lanza, BS, RDMS, RVT   

## 2019-02-15 ENCOUNTER — Telehealth: Payer: Self-pay | Admitting: Neurology

## 2019-02-15 ENCOUNTER — Other Ambulatory Visit: Payer: Self-pay | Admitting: *Deleted

## 2019-02-15 ENCOUNTER — Telehealth: Payer: Self-pay

## 2019-02-15 DIAGNOSIS — H814 Vertigo of central origin: Secondary | ICD-10-CM

## 2019-02-15 NOTE — Telephone Encounter (Signed)
I need the MRI Brain order to be put in again, it has [facility] listed as the service place and wont let me schedule on our mobile unit. DW

## 2019-02-15 NOTE — Telephone Encounter (Signed)
I spoke to the patient and she verbalized understanding of the results below.  States she is already taking aspirin 81mg  daily and will continue this medication.

## 2019-02-15 NOTE — Telephone Encounter (Signed)
Noted, thanks. Patient has been scheduled. DW

## 2019-02-15 NOTE — Telephone Encounter (Signed)
Summary: Right Carotid: Velocities in the right ICA are consistent with a 1-39% stenosis.  Left Carotid: Velocities in the left ICA are consistent with a 1-39% stenosis.  Vertebrals:  Right vertebral artery demonstrates antegrade flow. Left vertebral              artery demonstrates systolic deceleration "bunny sign". Subclavians: Left subclavian artery was stenotic. Right subclavian artery flow              was disturbed.  *See table(s) above for measurements and observations.   Ultrasound of carotid artery demonstrated less than 39% stenosis bilaterally

## 2019-02-16 ENCOUNTER — Other Ambulatory Visit: Payer: Self-pay

## 2019-02-16 ENCOUNTER — Ambulatory Visit: Payer: No Typology Code available for payment source

## 2019-02-16 DIAGNOSIS — H814 Vertigo of central origin: Secondary | ICD-10-CM

## 2019-02-16 MED ORDER — GADOBENATE DIMEGLUMINE 529 MG/ML IV SOLN
10.0000 mL | Freq: Once | INTRAVENOUS | Status: AC | PRN
  Administered 2019-02-16: 12:00:00 10 mL via INTRAVENOUS

## 2019-02-21 ENCOUNTER — Telehealth: Payer: Self-pay | Admitting: *Deleted

## 2019-02-21 NOTE — Telephone Encounter (Signed)
-----   Message from Melvenia Beam, MD sent at 02/19/2019  8:31 AM EST ----- MRI of the brain unremarkable, no cause for symptoms seen. Thanks.

## 2019-02-21 NOTE — Telephone Encounter (Signed)
Called, LVM for pt about MRI results (Ok per [facility]). Asked her to call back to schedule f/u around 06/07/2019 per last OV note. She has none scheduled at this time.

## 2019-03-03 MED FILL — CHLORTHALIDONE 25 MG TABS: 25 | 90 days supply | Qty: 90 | Fill #1

## 2019-03-03 MED FILL — FLUoxetine HCL 20 MG CAPS: 20 | 90 days supply | Qty: 90 | Fill #0

## 2019-03-03 MED FILL — LOSARTAN POTASSIUM 100 MG T: 100 | 90 days supply | Qty: 90 | Fill #1

## 2019-03-03 MED FILL — SIMVASTATIN 80 MG TABLET: 80 | 90 days supply | Qty: 90 | Fill #1

## 2019-03-10 ENCOUNTER — Ambulatory Visit: Payer: No Typology Code available for payment source | Attending: Otolaryngology | Admitting: Physical Therapy

## 2019-03-10 ENCOUNTER — Other Ambulatory Visit: Payer: Self-pay

## 2019-03-10 ENCOUNTER — Encounter: Payer: Self-pay | Admitting: Physical Therapy

## 2019-03-10 VITALS — BP 148/75 | HR 66

## 2019-03-10 DIAGNOSIS — H8112 Benign paroxysmal vertigo, left ear: Secondary | ICD-10-CM | POA: Diagnosis present

## 2019-03-10 DIAGNOSIS — R42 Dizziness and giddiness: Secondary | ICD-10-CM | POA: Diagnosis present

## 2019-03-10 DIAGNOSIS — R2681 Unsteadiness on feet: Secondary | ICD-10-CM | POA: Insufficient documentation

## 2019-03-10 NOTE — Therapy (Signed)
[facility] [address] [place], [place], [address] Phone: [phone]   Fax:  [phone]  Physical Therapy Evaluation  Patient Details  Name: Elizabeth Li MRN: [id number] Date of Birth: 08-05-1955 Referring Provider (PT): Floyde Parkins MD    Encounter Date: 03/10/2019  PT End of Session - 03/10/19 1212    Visit Number  1    Number of Visits  5    Date for PT Re-Evaluation  04/14/19   to allow for 1 wk gap in tx   Authorization Type  [employer] [facility] Focus Plan; $30 co-pay; VL: none; Auth: none    PT Start Time  [phone]    PT Stop Time  0935    PT Time Calculation (min)  46 min    Activity Tolerance  Patient tolerated treatment well    Behavior During Therapy  WFL for tasks assessed/performed       Past Medical History:  Diagnosis Date  . Arthritis   . Benign essential HTN 11/20/2016  . Chest pain 11/20/2016  . Diverticulitis   . History of frequent urinary tract infections   . Hypercholesterolemia   . Hyperlipidemia 11/20/2016  . Hypertension   . Wears glasses     Past Surgical History:  Procedure Laterality Date  . COLONOSCOPY  07/15/2008   polyps-done in [place]  . COLONOSCOPY N/A 11/24/2014   Dr. Gala Romney: colonic diverticulosis, melanosis coli, surveillance in 2021  . FOOT SURGERY    . LAPAROSCOPIC PARTIAL COLECTOMY N/A 10/08/2015   Procedure: LAPAROSCOPIC PARTIAL COLECTOMY;  Surgeon: Excell Seltzer, MD;  Location: [facility];  Service: General;  Laterality: N/A;  . TONSILLECTOMY    . TRIGGER FINGER RELEASE Right 02/27/2015   Procedure: RELEASE TRIGGER FINGER/A-1 PULLEY RIGHT LONG FINGER;  Surgeon: Leanora Cover, MD;  Location: [facility];  Service: Orthopedics;  Laterality: Right;    Vitals:   03/10/19 0907  BP: (!) 148/75  Li: 66     Subjective Assessment - 03/10/19 0855    Subjective  Patient has hx of vertigo that began around 10/16/2018. She had woken up early with severe  vertigo at the time, and had multiple episodes of nausea and vomiting. Went to the ED and was triaged for possible stroke. MRI revealed unremarkable. Recent MRI on 02/16/19 for assessment of central vertigo revealed unremarkable. Sx have stayed the same. She has vertigo when first waking up in the morning with supine > sit transition and avoids sleeping or rolling to L side d/t dizziness. She has c/o of increased numbess in L foot especially when walking >1.5 miles and has pain in bilateral feet. Typically very active until onset of dizziness.    Pertinent History  arthritis, HTN, diverticulitis, hypercholesterolemia, hyperlipidemia, carotid stenosis bilaterally <39%    Patient Stated Goals  get rid of dizziness, work out again    Currently in Pain?  No/denies         [facility] PT Assessment - 03/10/19 0857      Assessment   Medical Diagnosis  Vertigo    Referring Provider (PT)  Kathrynn Ducking, MD     Onset Date/Surgical Date  10/16/18    Hand Dominance  Right    Prior Therapy  None       Precautions   Precautions  Fall    Precaution Comments  Vertigo sx       Restrictions   Weight Bearing Restrictions  No      Balance Screen  Has the patient fallen in the past 6 months  No    Has the patient had a decrease in activity level because of a fear of falling?   No    Is the patient reluctant to leave their home because of a fear of falling?   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  Dizziness when waking up first thing in the morning. Can't lay on L side d/t dizziness. Normal visual changes with age. Sometimes gets a sudden wave of deafness in L ear.     Type of Dizziness   Spinning;Vertigo   L aural fullness    Frequency of Dizziness  daily    Duration of Dizziness  at least 5 minutes     Symptom Nature  Positional    Aggravating Factors  Supine to sit;Rolling to left    Relieving Factors  Slow movements    Progression of Symptoms  No change since onset   except for some numbess in L foot inc   History of similar episodes  None      Oculomotor Exam   Oculomotor Alignment  Normal    Ocular ROM  WNL    Spontaneous  Absent    Gaze-induced   Left beating nystagmus with L gaze    Smooth Pursuits  Intact    Saccades  Intact      Oculomotor Exam-Fixation Suppressed    Left Head Impulse  Positive    Right Head Impulse  Negative       Vestibulo-Ocular Reflex   VOR to Slow Head Movement  Normal    VOR Cancellation  Normal      Positional Testing   Dix-Hallpike  Dix-Hallpike Right;Dix-Hallpike Left    Horizontal Canal Testing  Horizontal Canal Right;Horizontal Canal Left      Dix-Hallpike Right   Dix-Hallpike Right Duration  0    Dix-Hallpike Right Symptoms  No nystagmus      Dix-Hallpike Left   Dix-Hallpike Left Duration  30    Dix-Hallpike Left Symptoms  Upbeat, left rotatory nystagmus      Horizontal Canal Right   Horizontal Canal Right Duration  0    Horizontal Canal Right Symptoms  Normal      Horizontal Canal Left   Horizontal Canal Left Duration  0    Horizontal Canal Left Symptoms  Nystagmus   upbeat, L rotatory         Objective measurements completed on examination: See above findings.       Vestibular Treatment/Exercise - 03/10/19 [id number]      Vestibular Treatment/Exercise   Vestibular Treatment Provided  Canalith Repositioning    Canalith Repositioning  Epley Manuever Left       EPLEY MANUEVER LEFT   Number of Reps   2  Overall Response   Improved Symptoms     RESPONSE DETAILS LEFT  Re-checked L dix hallpike revealing minimal left rotatory and upbeating nystagmus, less compared to 1st assessment.            PT Education - 03/10/19 1211    Education Details  PT role, Exam findings, what BPPV is & corresponding tx, POC    Person(s) Educated  Patient    Methods  Explanation    Comprehension  Verbalized understanding       PT Short Term Goals - 03/10/19 1214      PT SHORT TERM GOAL #1   Title  = LTG        PT Long Term Goals - 03/10/19 1216      PT LONG TERM GOAL #1   Title  Patient will demonstrate independence with vestibular and balance HEP (all LTGs due: 04/14/2019)    Time  4    Period  Weeks    Status  New    Target Date  --      PT LONG TERM GOAL #2   Title  Patient will report no dizziness with supine <> sit and rolling in bed to the L    Time  4    Period  Weeks    Status  New    Target Date  --      PT LONG TERM GOAL #3   Title  Patient will demonstrate negative positional testing for peripheral canals    Time  4    Period  Weeks    Status  New    Target Date  --      PT LONG TERM GOAL #4   Title  Pt will report being able to return to regular exercise routine without symptoms of dizziness.    Time  4    Period  Weeks    Status  New             Plan - 03/10/19 1227    Clinical Impression Statement  Patient is a pleasant 63 yo female presenting to Neuro OPPT for vertigo. Her PMH is significant for: arthritis, HTN, diverticulitis, hypercholesterolemia, hyperlipidemia, [place] of carotis artery demonstrated less than 39% stenosis bilaterally. PT examination revealed left rotatory and upbeating nystagmus with L dix-hallpike that reduced in intensity with 2 repetitions of the Epley maneuver. She had a (+) L head impulse test indicating vestibular hypofunction. Pt can benefit from skilled therapy to address deficits to return to prior level of function without limitation.     Personal Factors and Comorbidities  Comorbidity 3+;Time since onset of injury/illness/exacerbation    Comorbidities  arthritis, HTN, diverticulitis, hypercholesterolemia, hyperlipidemia, [place] of carotis artery demonstrated less than 39% stenosis bilaterally on 02/14/19    Examination-Activity Limitations  Bend;Sleep;Bed Mobility    Examination-Participation Restrictions  Community Activity;Other   exercise   Stability/Clinical Decision Making  Stable/Uncomplicated    Clinical Decision Making  Low    Rehab Potential  Excellent    PT Frequency  1x / week    PT Duration  4 weeks    PT Treatment/Interventions  ADLs/Self Care Home Management;Canalith Repostioning;Balance training;Therapeutic exercise;Therapeutic activities;Functional mobility training;Stair training;Gait training;Neuromuscular re-education;Patient/family education;Vestibular    PT Next Visit Plan  No combined cervical motions at end ranges -- <39% carotid stenosis. Re-assess L pBPPV & treat as indicated. Initiate x1 viewing and habituation.    Consulted and Agree with Plan of Care  Patient       Patient  will benefit from skilled therapeutic intervention in order to improve the following deficits and impairments:  Difficulty walking, Dizziness, Decreased balance, Impaired sensation  Visit Diagnosis: BPPV (benign paroxysmal positional vertigo), left  Dizziness and giddiness  Unsteadiness on feet     Problem List Patient Active Problem List   Diagnosis Date Noted  . Vertigo 10/16/2018  . Hypokalemia 10/16/2018  . Rectal bleeding 02/11/2017  . Chest pain 11/20/2016  . Benign essential HTN 11/20/2016  . Hyperlipidemia 11/20/2016  . Abnormal CT scan, colon   . Diverticulitis of colon 11/09/2014  . Abdominal bloating 11/09/2014    Elizabeth Li SPT 03/10/2019, 12:31 PM  [facility] [address] [place], [place], [address] Phone: [phone]   Fax:   [phone]  Name: Elizabeth Li MRN: [id number] Date of Birth: [birth year]

## 2019-03-21 ENCOUNTER — Other Ambulatory Visit: Payer: Self-pay

## 2019-03-21 ENCOUNTER — Encounter: Payer: Self-pay | Admitting: Physical Therapy

## 2019-03-21 ENCOUNTER — Ambulatory Visit: Payer: No Typology Code available for payment source | Attending: Otolaryngology | Admitting: Physical Therapy

## 2019-03-21 DIAGNOSIS — H8112 Benign paroxysmal vertigo, left ear: Secondary | ICD-10-CM | POA: Insufficient documentation

## 2019-03-21 DIAGNOSIS — R2681 Unsteadiness on feet: Secondary | ICD-10-CM | POA: Insufficient documentation

## 2019-03-21 DIAGNOSIS — H8111 Benign paroxysmal vertigo, right ear: Secondary | ICD-10-CM | POA: Insufficient documentation

## 2019-03-21 DIAGNOSIS — R42 Dizziness and giddiness: Secondary | ICD-10-CM | POA: Insufficient documentation

## 2019-03-21 NOTE — Patient Instructions (Signed)
Habituation - Sit to Side-Lying   Sit on edge of bed. Lie down onto the right side and hold until dizziness stops, plus 20 seconds.  Return to sitting and wait until dizziness stops, plus 20 seconds.  Repeat to the left side. Repeat sequence 3 times per session. Do 1-2 sessions per day.  Copyright  V[employer]. All rights reserved.

## 2019-03-22 NOTE — Therapy (Signed)
[facility] [address] [place], [place], [address] Phone: [phone]   Fax:  [phone]  Physical Therapy Treatment  Patient Details  Name: Elizabeth Li MRN: [id number] Date of Birth: 17-Jun-1955 Referring Provider (PT): Kathrynn Ducking, MD    Encounter Date: 03/21/2019  PT End of Session - 03/22/19 1044    Visit Number  2    Number of Visits  5    Date for PT Re-Evaluation  04/14/19   to allow for 1 wk gap in tx   Authorization Type  [employer] [facility] Focus Plan; $30 co-pay; VL: none; Auth: none    PT Start Time  1620    PT Stop Time  1700    PT Time Calculation (min)  40 min    Activity Tolerance  Patient tolerated treatment well    Behavior During Therapy  WFL for tasks assessed/performed       Past Medical History:  Diagnosis Date  . Arthritis   . Benign essential HTN 11/20/2016  . Chest pain 11/20/2016  . Diverticulitis   . History of frequent urinary tract infections   . Hypercholesterolemia   . Hyperlipidemia 11/20/2016  . Hypertension   . Wears glasses     Past Surgical History:  Procedure Laterality Date  . COLONOSCOPY  07/15/2008   polyps-done in [place]  . COLONOSCOPY N/A 11/24/2014   Dr. Gala Romney: colonic diverticulosis, melanosis coli, surveillance in 2021  . FOOT SURGERY    . LAPAROSCOPIC PARTIAL COLECTOMY N/A 10/08/2015   Procedure: LAPAROSCOPIC PARTIAL COLECTOMY;  Surgeon: Excell Seltzer, MD;  Location: [facility];  Service: General;  Laterality: N/A;  . TONSILLECTOMY    . TRIGGER FINGER RELEASE Right 02/27/2015   Procedure: RELEASE TRIGGER FINGER/A-1 PULLEY RIGHT LONG FINGER;  Surgeon: Leanora Cover, MD;  Location: [facility];  Service: Orthopedics;  Laterality: Right;    There were no vitals filed for this visit.  Subjective Assessment - 03/21/19 1623    Subjective  Morning episodes have significantly improved but is now having all day wooziness and lightheadedness,  especially with tipping her head back.    Pertinent History  arthritis, HTN, diverticulitis, hypercholesterolemia, hyperlipidemia, carotid stenosis bilaterally <39%    Patient Stated Goals  get rid of dizziness, work out again    Currently in Pain?  No/denies             Vestibular Assessment - 03/21/19 1624      Positional Testing   Dix-Hallpike  Dix-Hallpike Right;Dix-Hallpike Left    Horizontal Canal Testing  Horizontal Canal Right;Horizontal Canal Left      Dix-Hallpike Right   Dix-Hallpike Right Duration  >1 minute    Dix-Hallpike Right Symptoms  Upbeat, right rotatory nystagmus      Dix-Hallpike Left   Dix-Hallpike Left Duration  0    Dix-Hallpike Left Symptoms  No nystagmus      Horizontal Canal Right   Horizontal Canal Right Duration  0    Horizontal Canal Right Symptoms  Normal      Horizontal Canal Left   Horizontal Canal Left Duration  0    Horizontal Canal Left Symptoms  Normal                Vestibular Treatment/Exercise - 03/21/19 1632      Vestibular Treatment/Exercise   Vestibular Treatment Provided  Canalith Repositioning    Canalith Repositioning  Semont Procedure Right Posterior;Epley Manuever Right    Habituation Exercises  Laruth Bouchard  Daroff       EPLEY MANUEVER RIGHT   Number of Reps   1    Overall Response  No change    Response Details   after performing 2 rounds of R posterior Semont manuever      Semont Procedure Right Posterior   Number of Reps   2    Overall Response   No change    Response Details   second repetition performed repeated head shaking x 20 and bone vibration before performing Semont      Nestor Lewandowsky   Number of Reps   1    Symptom Description   Demonstrated to patient how to perform as part of HEP.  Pt to perform x 1 week and then return for re-assessment            PT Education - 03/22/19 1043    Education Details  vestibular findings of bilateral BPPV - L sided symptoms mitigated; pt now presents with  primarily R posterior canal cupulolithiasis.  Nestor Lewandowsky for HEP    Person(s) Educated  Patient    Methods  Explanation;Demonstration;Handout    Comprehension  Verbalized understanding;Returned demonstration       PT Short Term Goals - 03/10/19 1214      PT SHORT TERM GOAL #1   Title  = LTG        PT Long Term Goals - 03/10/19 1216      PT LONG TERM GOAL #1   Title  Patient will demonstrate independence with vestibular and balance HEP (all LTGs due: 04/14/2019)    Time  4    Period  Weeks    Status  New    Target Date  --      PT LONG TERM GOAL #2   Title  Patient will report no dizziness with supine <> sit and rolling in bed to the L    Time  4    Period  Weeks    Status  New    Target Date  --      PT LONG TERM GOAL #3   Title  Patient will demonstrate negative positional testing for peripheral canals    Time  4    Period  Weeks    Status  New    Target Date  --      PT LONG TERM GOAL #4   Title  Pt will report being able to return to regular exercise routine without symptoms of dizziness.    Time  4    Period  Weeks    Status  New            Plan - 03/22/19 1045    Clinical Impression Statement  Pt returns with continued symptoms of mild dizziness/wooziness.  Symptoms of spinning vertigo with bed mobility have resolved.  Upon reassessment pt's L posterior canal canalithiasis has resolved.  Pt now presents with low amplitude R rotary, upbeating nystagmus with a duration lasting >1 minute indicating R posterior canal cupulolithiasis, treated x 2 with Semont procedure and 1 CRM with no change in symptoms.  Pt will be out of town for 1.5 weeks - provided pt with Nestor Lewandowsky to perform as HEP.  Will have pt return for re-assessment and further treatment as indicated.    Personal Factors and Comorbidities  Comorbidity 3+;Time since onset of injury/illness/exacerbation    Comorbidities  arthritis, HTN, diverticulitis, hypercholesterolemia, hyperlipidemia, [place] of  carotid artery demonstrated less than 39% stenosis bilaterally on  02/14/19    Examination-Activity Limitations  Bend;Sleep;Bed Mobility    Examination-Participation Restrictions  Community Activity;Other   exercise   Stability/Clinical Decision Making  Stable/Uncomplicated    Rehab Potential  Excellent    PT Frequency  1x / week    PT Duration  4 weeks    PT Treatment/Interventions  ADLs/Self Care Home Management;Canalith Repostioning;Balance training;Therapeutic exercise;Therapeutic activities;Functional mobility training;Stair training;Gait training;Neuromuscular re-education;Patient/family education;Vestibular    PT Next Visit Plan  No combined cervical motions at end ranges -- <39% carotid stenosis - use treatment table in middle room for extension. reassess for R cupulolithiasis.  Initiate x1 viewing and habituation.    Consulted and Agree with Plan of Care  Patient       Patient will benefit from skilled therapeutic intervention in order to improve the following deficits and impairments:  Dizziness, Decreased balance, Impaired sensation  Visit Diagnosis: Dizziness and giddiness  Unsteadiness on feet  BPPV (benign paroxysmal positional vertigo), right     Problem List Patient Active Problem List   Diagnosis Date Noted  . Vertigo 10/16/2018  . Hypokalemia 10/16/2018  . Rectal bleeding 02/11/2017  . Chest pain 11/20/2016  . Benign essential HTN 11/20/2016  . Hyperlipidemia 11/20/2016  . Abnormal CT scan, colon   . Diverticulitis of colon 11/09/2014  . Abdominal bloating 11/09/2014    Rico Junker, PT, DPT 03/22/19    11:00 AM    [facility] [address] [place], [place], [address] Phone: [phone]   Fax:  [phone]  Name: Elizabeth Li MRN: [id number] Date of Birth: 27-Mar-1955

## 2019-03-24 ENCOUNTER — Encounter: Payer: Self-pay | Admitting: Podiatry

## 2019-03-24 ENCOUNTER — Other Ambulatory Visit: Payer: Self-pay

## 2019-03-24 ENCOUNTER — Ambulatory Visit (INDEPENDENT_AMBULATORY_CARE_PROVIDER_SITE_OTHER): Payer: No Typology Code available for payment source

## 2019-03-24 ENCOUNTER — Ambulatory Visit: Payer: No Typology Code available for payment source | Admitting: Podiatry

## 2019-03-24 ENCOUNTER — Other Ambulatory Visit: Payer: Self-pay | Admitting: Podiatry

## 2019-03-24 VITALS — Temp 97.6°F

## 2019-03-24 DIAGNOSIS — M779 Enthesopathy, unspecified: Secondary | ICD-10-CM

## 2019-03-24 DIAGNOSIS — M778 Other enthesopathies, not elsewhere classified: Secondary | ICD-10-CM | POA: Diagnosis not present

## 2019-03-24 DIAGNOSIS — M79672 Pain in left foot: Secondary | ICD-10-CM | POA: Diagnosis not present

## 2019-03-24 DIAGNOSIS — D3610 Benign neoplasm of peripheral nerves and autonomic nervous system, unspecified: Secondary | ICD-10-CM | POA: Diagnosis not present

## 2019-03-24 DIAGNOSIS — M7752 Other enthesopathy of left foot: Secondary | ICD-10-CM | POA: Diagnosis not present

## 2019-03-24 DIAGNOSIS — M79671 Pain in right foot: Secondary | ICD-10-CM

## 2019-03-24 DIAGNOSIS — M7751 Other enthesopathy of right foot: Secondary | ICD-10-CM

## 2019-03-25 NOTE — Progress Notes (Signed)
Subjective:   Patient ID: Elizabeth Li, female   DOB: 64 y.o.   MRN: [id number]   HPI Patient presents stating that she has been having trouble with both her feet has had a long-term history of orthotics and states at times she gets lesions.  Also has knots on the bottom of both feet with family history of this and they are not currently tender but they have been present for a long time.  Patient does not smoke likes to be active   Review of Systems  All other systems reviewed and are negative.       Objective:  Physical Exam Vitals and nursing note reviewed.  Constitutional:      Appearance: She is well-developed.  Pulmonary:     Effort: Pulmonary effort is normal.  Musculoskeletal:        General: Normal range of motion.  Skin:    General: Skin is warm.  Neurological:     Mental Status: She is alert.     Neurovascular status intact muscle strength was found to be adequate range of motion within normal limits.  Patient is noted to have prominent metatarsals bilateral with diminished fat pad and moderate discomfort underneath the bone structure.  Is also noted to have nodules on the plantar fascial band bilateral     Assessment:  Inflammatory capsulitis bilateral with prominent bone structure with plantar fibroma formation bilateral     Plan:  H&P conditions reviewed and today I went ahead and casted for functional orthotics to reduce pressure against the joint surfaces.  This was done by ped orthotist and hopefully will have those back soon  X-rays indicate that there is some stress on joint surface with mild arthritis but no other signs of pathology

## 2019-04-11 ENCOUNTER — Other Ambulatory Visit: Payer: No Typology Code available for payment source | Admitting: Orthotics

## 2019-04-13 ENCOUNTER — Ambulatory Visit: Payer: No Typology Code available for payment source | Admitting: Orthotics

## 2019-04-13 ENCOUNTER — Other Ambulatory Visit: Payer: Self-pay

## 2019-04-13 DIAGNOSIS — M79672 Pain in left foot: Secondary | ICD-10-CM

## 2019-04-13 DIAGNOSIS — M79671 Pain in right foot: Secondary | ICD-10-CM

## 2019-04-13 DIAGNOSIS — M779 Enthesopathy, unspecified: Secondary | ICD-10-CM

## 2019-04-13 NOTE — Progress Notes (Signed)
Patient came in today to p/up functional foot orthotics.   The orthotics were assessed to both fit and function.  The F/O addressed the biomechanical issues/pathologies as intended, offering good longitudinal arch support, proper offloading, and foot support. There weren't any signs of discomfort or irritation.  The F/O fit properly in footwear with minimal trimming/adjustments. 

## 2019-04-21 ENCOUNTER — Other Ambulatory Visit (HOSPITAL_COMMUNITY): Payer: Self-pay | Admitting: Internal Medicine

## 2019-04-21 ENCOUNTER — Ambulatory Visit: Payer: No Typology Code available for payment source | Admitting: Orthotics

## 2019-04-21 ENCOUNTER — Other Ambulatory Visit: Payer: Self-pay

## 2019-04-21 DIAGNOSIS — Z1231 Encounter for screening mammogram for malignant neoplasm of breast: Secondary | ICD-10-CM

## 2019-04-21 DIAGNOSIS — M779 Enthesopathy, unspecified: Secondary | ICD-10-CM

## 2019-04-21 NOTE — Progress Notes (Signed)
Remaking f/o.  Need 1) sulcus length, 2) dress slim, 3) vinyl cover 4) more narrow

## 2019-04-28 ENCOUNTER — Other Ambulatory Visit: Payer: Self-pay

## 2019-04-28 ENCOUNTER — Ambulatory Visit (HOSPITAL_COMMUNITY)
Admission: RE | Admit: 2019-04-28 | Discharge: 2019-04-28 | Disposition: A | Discharge status: Home / Self Care | Payer: No Typology Code available for payment source | Source: Ambulatory Visit | Attending: Internal Medicine | Admitting: Internal Medicine

## 2019-04-28 DIAGNOSIS — Z1231 Encounter for screening mammogram for malignant neoplasm of breast: Secondary | ICD-10-CM | POA: Diagnosis not present

## 2019-05-17 ENCOUNTER — Telehealth: Payer: Self-pay | Admitting: Podiatry

## 2019-05-17 NOTE — Telephone Encounter (Signed)
Pt left message yesterday that a second pair of orthotics were to be ordered on 1/27 and she has not heard anything yet.    I returned call and left message that the second pair have shipped and I am in the process of checking all of the orthotics in and will call her if they are in.

## 2019-05-18 ENCOUNTER — Telehealth: Payer: Self-pay | Admitting: Neurology

## 2019-05-18 DIAGNOSIS — R42 Dizziness and giddiness: Secondary | ICD-10-CM

## 2019-05-18 NOTE — Telephone Encounter (Signed)
I called the patient, she wants to have another opinion regarding her dizziness, I will get her set up for the balance disorders clinic, telephone number is [phone].

## 2019-05-18 NOTE — Telephone Encounter (Signed)
Patient called stating she would like to get a referral to be seen at [facility] of neurology for a second opinion   Fax#[phone]

## 2019-05-27 MED FILL — CHLORTHALIDONE 25 MG TABS: 25 | 90 days supply | Qty: 90 | Fill #2

## 2019-06-01 MED FILL — FLUoxetine HCL 20 MG CAPS: 20 | 90 days supply | Qty: 90 | Fill #0

## 2019-06-01 MED FILL — LOSARTAN POTASSIUM 100 MG T: 100 | 90 days supply | Qty: 90 | Fill #0

## 2019-07-11 ENCOUNTER — Telehealth: Payer: Self-pay | Admitting: Podiatry

## 2019-07-11 NOTE — Telephone Encounter (Signed)
Pt left message stating she got orthotics and they are not working for her at all. She has had orthotics before and they make her feet feel horrible. She just wants to return them.She has not paid for them yet. What does she need to do? Can she just drop them off?

## 2019-07-13 ENCOUNTER — Other Ambulatory Visit: Payer: Self-pay | Admitting: Orthotics

## 2019-07-13 DIAGNOSIS — M779 Enthesopathy, unspecified: Secondary | ICD-10-CM

## 2019-07-13 NOTE — Progress Notes (Signed)
Called patient to advise we would be unable to refund for the f/o, as they are custom devices AND she signed a financial responsibility form.

## 2019-07-25 ENCOUNTER — Ambulatory Visit: Payer: Medicare Other | Admitting: Orthotics

## 2019-07-25 ENCOUNTER — Other Ambulatory Visit: Payer: Self-pay

## 2019-07-25 DIAGNOSIS — M79672 Pain in left foot: Secondary | ICD-10-CM

## 2019-07-25 DIAGNOSIS — M79671 Pain in right foot: Secondary | ICD-10-CM

## 2019-07-25 NOTE — Progress Notes (Signed)
Redoing f/o to take out met pad make full lentth

## 2019-07-28 DIAGNOSIS — R0602 Shortness of breath: Secondary | ICD-10-CM | POA: Insufficient documentation

## 2019-07-28 DIAGNOSIS — R55 Syncope and collapse: Secondary | ICD-10-CM | POA: Insufficient documentation

## 2020-01-03 ENCOUNTER — Encounter: Payer: Self-pay | Admitting: Internal Medicine

## 2020-02-23 ENCOUNTER — Ambulatory Visit: Payer: No Typology Code available for payment source

## 2020-03-20 ENCOUNTER — Other Ambulatory Visit (HOSPITAL_COMMUNITY): Payer: Self-pay | Admitting: Internal Medicine

## 2020-03-20 DIAGNOSIS — Z1231 Encounter for screening mammogram for malignant neoplasm of breast: Secondary | ICD-10-CM

## 2020-04-05 ENCOUNTER — Encounter: Payer: Self-pay | Admitting: Physical Therapy

## 2020-04-05 NOTE — Therapy (Signed)
[facility] [address] [place], [place], [address] Phone: [phone]   Fax:  [phone]  Patient Details  Name: Elizabeth Li MRN: [id number] Date of Birth: 04-17-1955 Referring Provider:  Kathrynn Ducking, MD   Encounter Date: 04/05/2020  PHYSICAL THERAPY DISCHARGE SUMMARY  Visits from Start of Care: 2  Current functional level related to goals / functional outcomes: Unable to formally assess; pt did not return for remainder of visits.   Remaining deficits: Dizziness   Education / Equipment: HEP  Plan: Patient agrees to discharge.  Patient goals were not met. Patient is being discharged due to not returning since the last visit.  ?????     Rico Junker, PT, DPT 04/05/20    2:36 PM    [facility] [address] [place], [place], [address] Phone: [phone]   Fax:  [phone]

## 2020-04-30 ENCOUNTER — Ambulatory Visit (HOSPITAL_COMMUNITY): Payer: No Typology Code available for payment source

## 2020-05-11 ENCOUNTER — Ambulatory Visit (HOSPITAL_COMMUNITY): Payer: No Typology Code available for payment source

## 2020-05-16 ENCOUNTER — Ambulatory Visit (HOSPITAL_COMMUNITY): Payer: No Typology Code available for payment source

## 2020-05-23 ENCOUNTER — Ambulatory Visit (HOSPITAL_COMMUNITY)
Admission: RE | Admit: 2020-05-23 | Discharge: 2020-05-23 | Disposition: A | Discharge status: Home / Self Care | Payer: Medicare Other | Source: Ambulatory Visit | Attending: Internal Medicine | Admitting: Internal Medicine

## 2020-05-23 ENCOUNTER — Other Ambulatory Visit: Payer: Self-pay

## 2020-05-23 DIAGNOSIS — Z1231 Encounter for screening mammogram for malignant neoplasm of breast: Secondary | ICD-10-CM | POA: Insufficient documentation

## 2020-05-29 ENCOUNTER — Other Ambulatory Visit (HOSPITAL_COMMUNITY): Payer: Self-pay | Admitting: Internal Medicine

## 2020-05-29 DIAGNOSIS — R928 Other abnormal and inconclusive findings on diagnostic imaging of breast: Secondary | ICD-10-CM

## 2020-06-05 ENCOUNTER — Ambulatory Visit (HOSPITAL_COMMUNITY)
Admission: RE | Admit: 2020-06-05 | Discharge: 2020-06-05 | Disposition: A | Discharge status: Home / Self Care | Payer: Medicare Other | Source: Ambulatory Visit | Attending: Internal Medicine | Admitting: Internal Medicine

## 2020-06-05 ENCOUNTER — Encounter (HOSPITAL_COMMUNITY): Payer: Self-pay

## 2020-06-05 DIAGNOSIS — R928 Other abnormal and inconclusive findings on diagnostic imaging of breast: Secondary | ICD-10-CM | POA: Insufficient documentation

## 2021-02-15 ENCOUNTER — Other Ambulatory Visit: Payer: Self-pay | Admitting: Orthopedic Surgery

## 2021-02-15 ENCOUNTER — Other Ambulatory Visit (HOSPITAL_COMMUNITY): Payer: Self-pay | Admitting: Orthopedic Surgery

## 2021-02-15 DIAGNOSIS — M4807 Spinal stenosis, lumbosacral region: Secondary | ICD-10-CM

## 2021-02-15 DIAGNOSIS — M25551 Pain in right hip: Secondary | ICD-10-CM

## 2021-02-20 ENCOUNTER — Other Ambulatory Visit: Payer: Self-pay

## 2021-02-20 ENCOUNTER — Ambulatory Visit
Admission: RE | Admit: 2021-02-20 | Discharge: 2021-02-20 | Disposition: A | Discharge status: Home / Self Care | Payer: Medicare Other | Source: Ambulatory Visit | Attending: Orthopedic Surgery | Admitting: Orthopedic Surgery

## 2021-02-20 DIAGNOSIS — M4807 Spinal stenosis, lumbosacral region: Secondary | ICD-10-CM | POA: Diagnosis present

## 2021-02-20 DIAGNOSIS — M25551 Pain in right hip: Secondary | ICD-10-CM | POA: Diagnosis present

## 2021-02-26 IMAGING — MR MRI OF THE LEFT SHOULDER WITHOUT CONTRAST
4 of 5 series · 19 of 40 positions shown · non-contrast
Comparison: None.

CLINICAL DATA: Chronic left shoulder and upper arm pain with
limited range of motion. No known injury.

EXAM:
MRI OF THE LEFT SHOULDER WITHOUT CONTRAST
TECHNIQUE: Multiplanar, multisequence MR imaging of the shoulder was performed.
No intravenous contrast was administered.

[Series 4: PD fat-sat · axial · 4.0mm · 0.28mm/px · z∈[-55,+30]mm · 8 of 20 slices shown (1 of 2)]
[im 1/20]
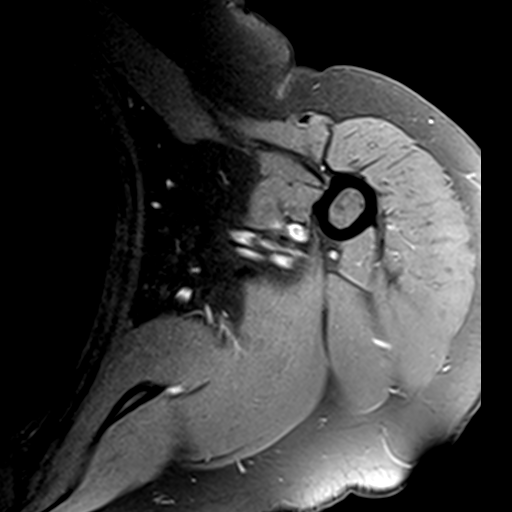
[im 3/20]
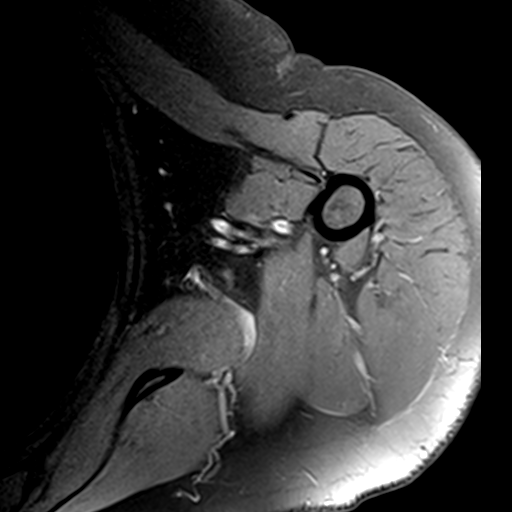
[im 6/20]
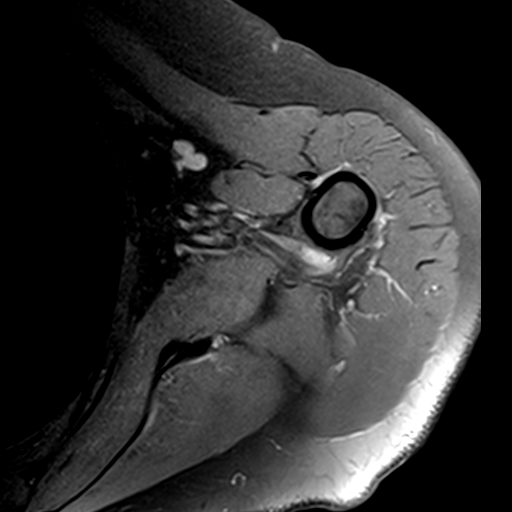
[im 9/20]
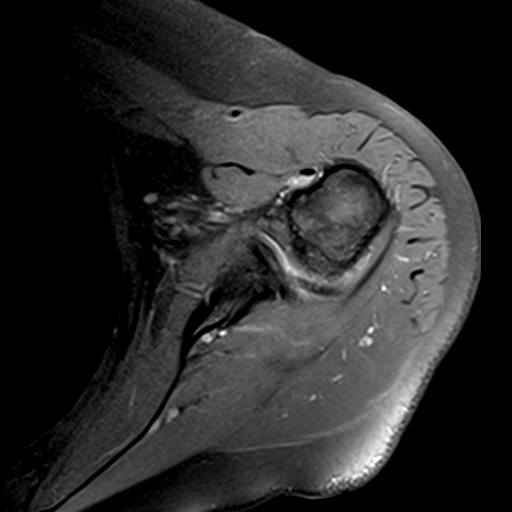
[im 11/20]
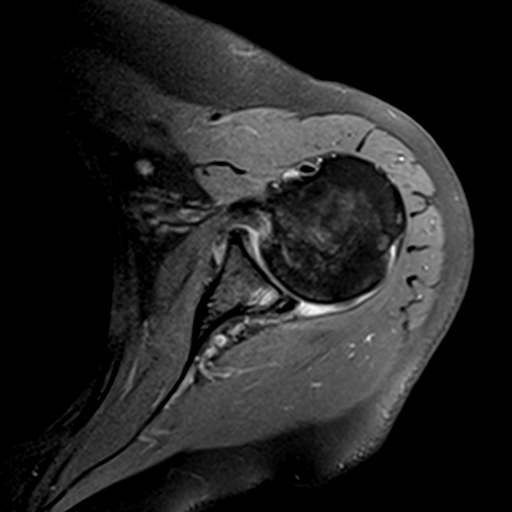
[im 14/20]
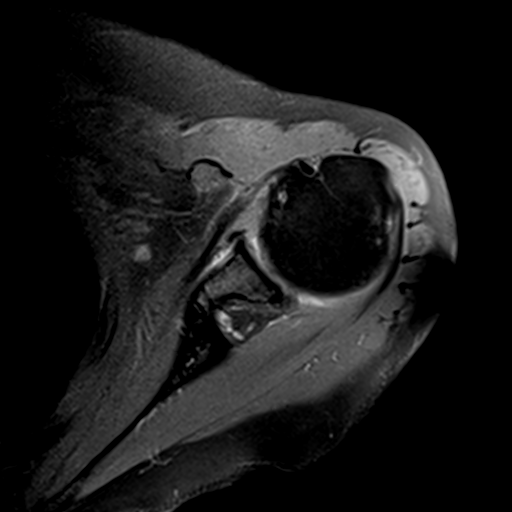
[im 17/20]
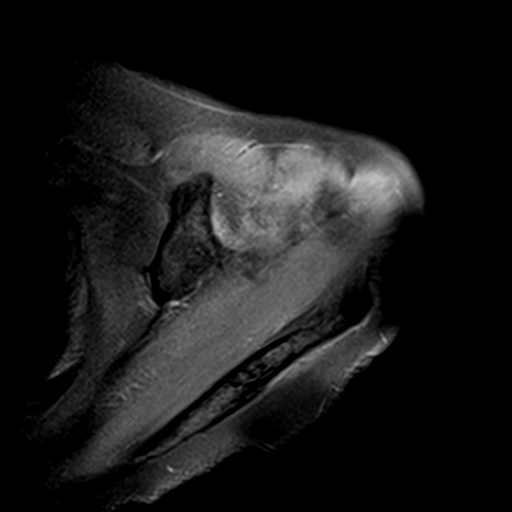
[im 20/20]
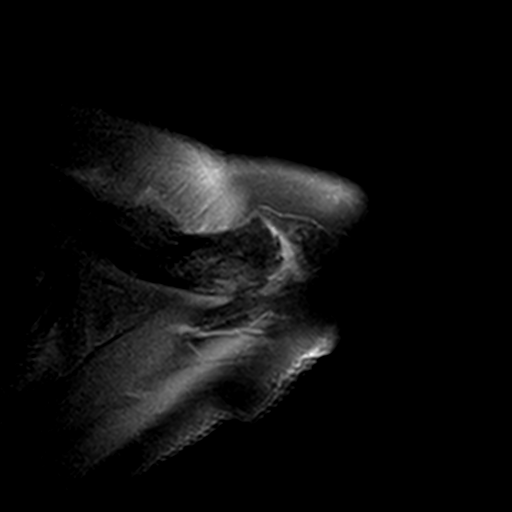

[Series 5: T2 fat-sat · oblique · 4.0mm · 0.28mm/px · 3 of 20 slices shown (1 of 2)]
[im 4/20]
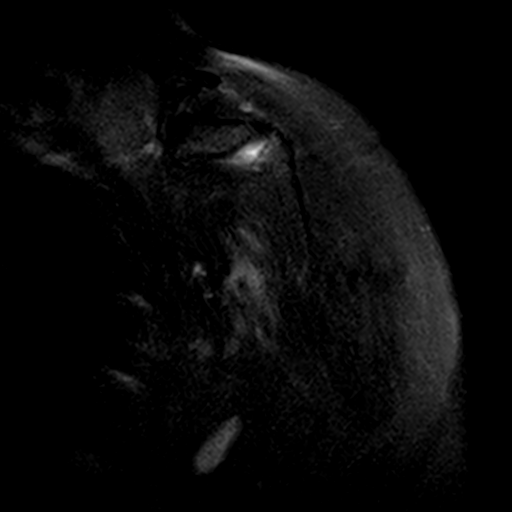
[im 10/20]
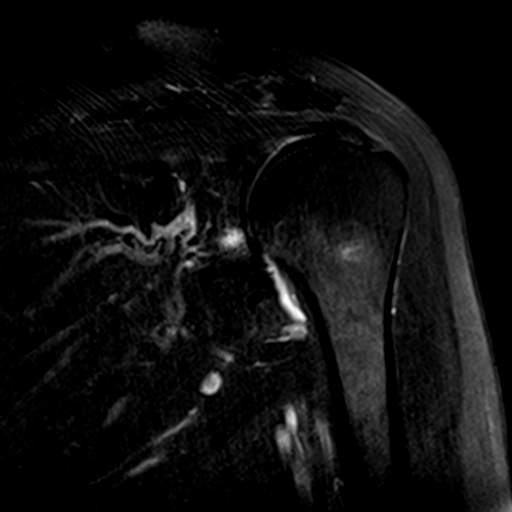
[im 16/20]
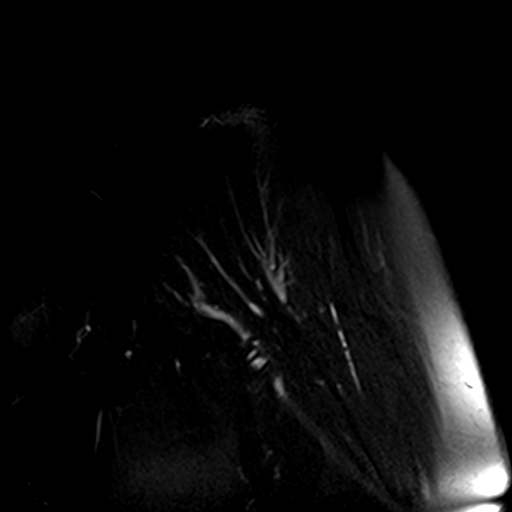

[Series 6: PD fat-sat · oblique · 4.0mm · 0.28mm/px · 5 of 20 slices shown (2 of 2)]
[im 1/20]
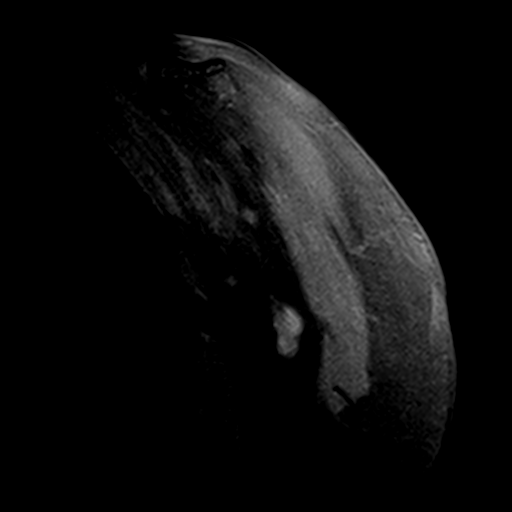
[im 4/20]
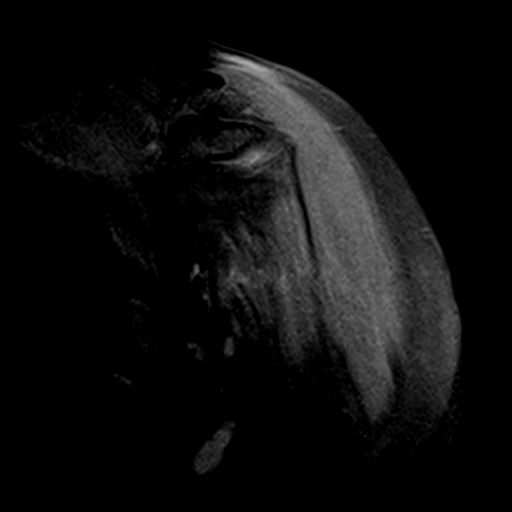
[im 7/20]
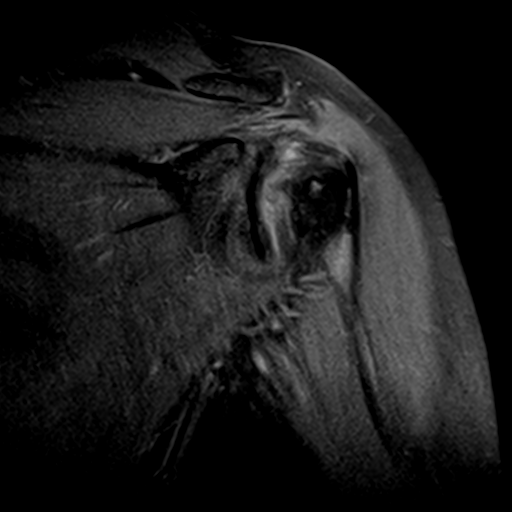
[im 10/20]
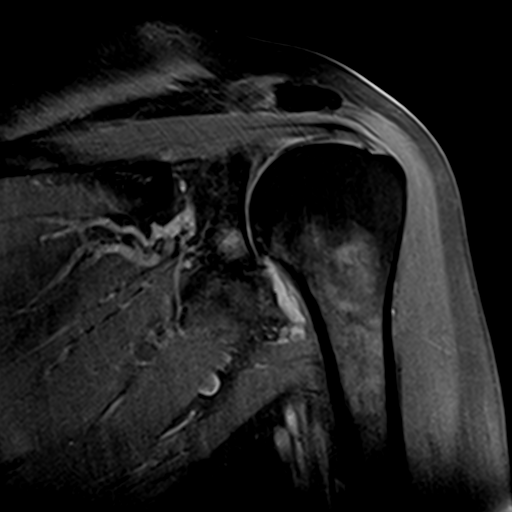
[im 16/20]
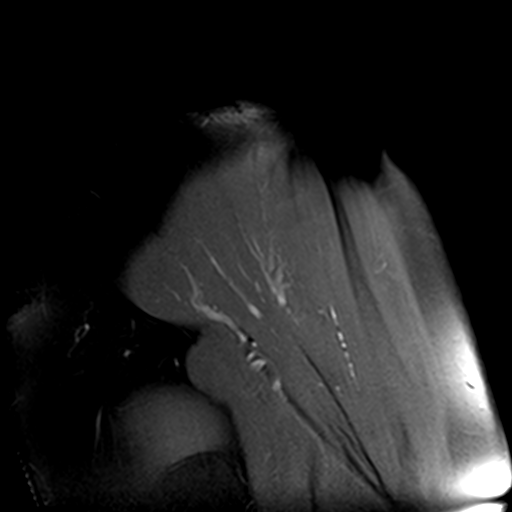

[Series 7: T2 fat-sat · oblique · 4.0mm · 0.28mm/px · 3 of 24 slices shown (2 of 2)]
[im 3/24]
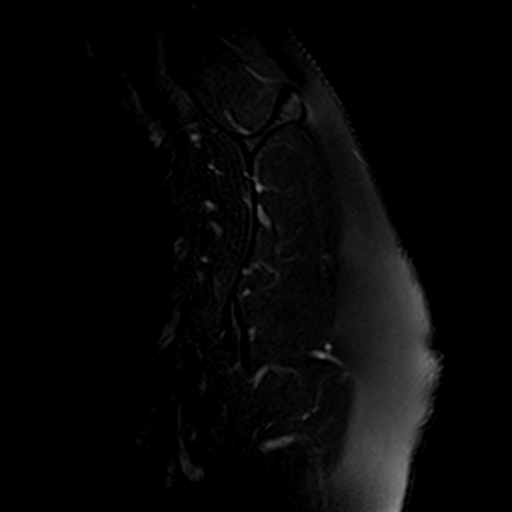
[im 12/24]
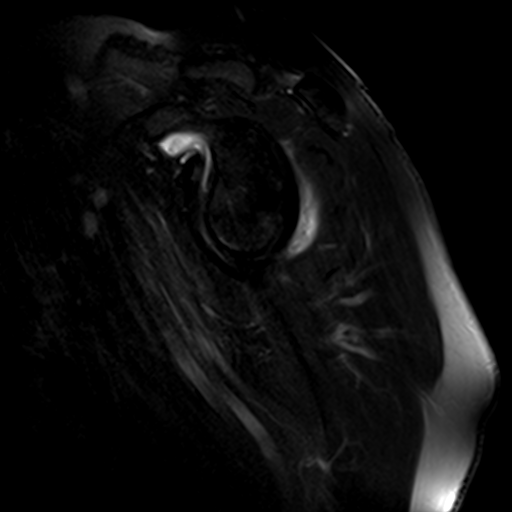
[im 21/24]
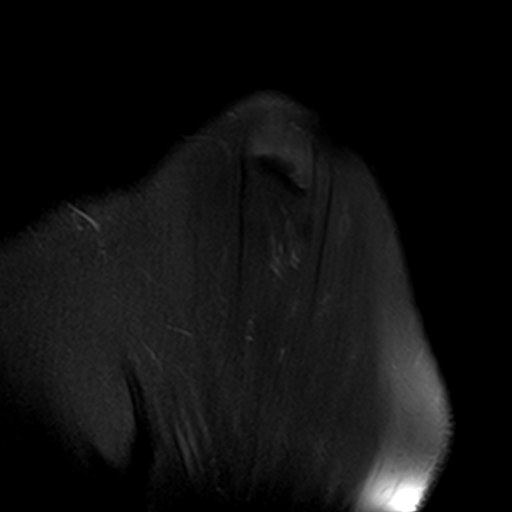

[19 of 40 positions shown; findings below may reference images not displayed]

FINDINGS: Rotator cuff: Heterogeneously increased T2 signal in the rotator
cuff tendons consistent with tendinopathy. No tear.

Muscles:  Normal without atrophy or focal lesion.

Biceps long head:  Intact.

Acromioclavicular Joint: Normal. Type 1 acromion. No
subacromial/subdeltoid bursal fluid.

Glenohumeral Joint: The patient has glenohumeral osteoarthritis.
Cartilage is thinned, there is an osteophyte off the humeral head
and subchondral edema in the posterior aspect of the glenoid. Mild
posterior subluxation of the humeral head on the glenoid is
identified.

Labrum:  Degenerative tear of the posterior labrum is noted.

Bones:  No fracture or worrisome lesion.

Other: None.
IMPRESSION: Dominant finding is glenohumeral osteoarthritis with an associated
degenerative tear of the posterior labrum.

Mild appearing rotator cuff tendinopathy without tear.

## 2021-04-23 ENCOUNTER — Other Ambulatory Visit (HOSPITAL_COMMUNITY): Payer: Self-pay | Admitting: Family Medicine

## 2021-04-23 DIAGNOSIS — Z1231 Encounter for screening mammogram for malignant neoplasm of breast: Secondary | ICD-10-CM

## 2021-06-06 ENCOUNTER — Ambulatory Visit (HOSPITAL_COMMUNITY): Payer: Medicare Other

## 2021-06-07 ENCOUNTER — Other Ambulatory Visit: Payer: Self-pay

## 2021-06-07 ENCOUNTER — Ambulatory Visit (HOSPITAL_COMMUNITY)
Admission: RE | Admit: 2021-06-07 | Discharge: 2021-06-07 | Disposition: A | Discharge status: Home / Self Care | Payer: Medicare Other | Source: Ambulatory Visit | Attending: Family Medicine | Admitting: Family Medicine

## 2021-06-07 DIAGNOSIS — Z1231 Encounter for screening mammogram for malignant neoplasm of breast: Secondary | ICD-10-CM | POA: Insufficient documentation

## 2021-10-09 ENCOUNTER — Other Ambulatory Visit: Payer: Self-pay | Admitting: Podiatry

## 2021-10-09 DIAGNOSIS — M25871 Other specified joint disorders, right ankle and foot: Secondary | ICD-10-CM

## 2021-10-16 ENCOUNTER — Ambulatory Visit (INDEPENDENT_AMBULATORY_CARE_PROVIDER_SITE_OTHER): Payer: Medicare Other | Admitting: Podiatry

## 2021-10-16 ENCOUNTER — Ambulatory Visit: Payer: Medicare Other | Admitting: Podiatry

## 2021-10-16 ENCOUNTER — Encounter: Payer: Self-pay | Admitting: Podiatry

## 2021-10-16 DIAGNOSIS — G5793 Unspecified mononeuropathy of bilateral lower limbs: Secondary | ICD-10-CM | POA: Diagnosis not present

## 2021-10-16 DIAGNOSIS — M722 Plantar fascial fibromatosis: Secondary | ICD-10-CM

## 2021-10-16 NOTE — Progress Notes (Signed)
Subjective:  Patient ID: Elizabeth Li, female    DOB: 1955-04-10,  MRN: [id number] HPI Chief Complaint  Patient presents with   Foot Pain    Plantar foot and toes bilateral (currently right is worse than left) - numbness and severe aching x 8-10 months, has been seeing Dr. Luana Shu, treating for idiopathic neuropathy, aching after short walks in right leg, feet and legs feel very heavy, fibromas bilateral arches, nerve conduction showed peripheral neuropathy, she is scheduled Monday (8/7) for MRI and vascular studies   New Patient (Initial Visit)    Est pt 06/2019    66 y.o. female presents with the above complaint.   ROS: Denies fever chills nausea vomiting muscle aches pains calf pain back pain chest pain shortness of breath.  She relates that her biggest complaint seems to be the heaviness of the pain in the legs after activity right greater than left as she pinpoints the pain in the posterior calf right.  She is seeing Dr. Luana Shu.  She is scheduled for vascular studies Monday as well as an MRI of the foot.  She states that she is concerned that her plantar fibromas may be causing her numbness in her feet.  Past Medical History:  Diagnosis Date   Arthritis    Benign essential HTN 11/20/2016   Chest pain 11/20/2016   Diverticulitis    History of frequent urinary tract infections    Hypercholesterolemia    Hyperlipidemia 11/20/2016   Hypertension    Wears glasses    Past Surgical History:  Procedure Laterality Date   COLONOSCOPY  07/15/2008   polyps-done in [place] N/A 11/24/2014   Dr. Gala Romney: colonic diverticulosis, melanosis coli, surveillance in 2021   [place] N/A 10/08/2015   Procedure: LAPAROSCOPIC PARTIAL COLECTOMY;  Surgeon: Excell Seltzer, MD;  Location: [facility];  Service: General;  Laterality: N/A;   TONSILLECTOMY     TRIGGER FINGER RELEASE Right 02/27/2015   Procedure: RELEASE TRIGGER FINGER/A-1 PULLEY RIGHT LONG  FINGER;  Surgeon: Leanora Cover, MD;  Location: [facility];  Service: Orthopedics;  Laterality: Right;    Current Outpatient Medications:    BIOTIN PO, Take by mouth., Disp: , Rfl:    aspirin EC 81 MG tablet, Take 81 mg by mouth daily., Disp: , Rfl:    cilostazol (PLETAL) 50 MG tablet, Take by mouth., Disp: , Rfl:    FLUoxetine (PROZAC) 20 MG capsule, Take 20 mg by mouth daily., Disp: , Rfl:    Glucosamine-Chondroitin (GLUCOSAMINE CHONDR COMPLEX PO), Take 2 tablets by mouth daily., Disp: , Rfl:    losartan (COZAAR) 100 MG tablet, Take 100 mg by mouth daily., Disp: , Rfl:    Multiple Vitamin (MULTIVITAMIN WITH MINERALS) TABS tablet, Take 1 tablet by mouth daily., Disp: , Rfl:    omeprazole (PRILOSEC) 20 MG capsule, Take 20 mg by mouth daily., Disp: , Rfl:    simvastatin (ZOCOR) 40 MG tablet, Take 40 mg by mouth daily., Disp: , Rfl:    VITAMIN D, CHOLECALCIFEROL, PO, Take 1 tablet by mouth daily., Disp: , Rfl:   Allergies  Allergen Reactions   Flagyl [Metronidazole] Other (See Comments)    Causes weakness and dizziness    Review of Systems Objective:  There were no vitals filed for this visit.  General: Well developed, nourished, in no acute distress, alert and oriented x3   Dermatological: Skin is warm, dry and supple bilateral. Nails x 10 are  well maintained; remaining integument appears unremarkable at this time. There are no open sores, no preulcerative lesions, no rash or signs of infection present.  Plantar fibromas medial aspect of bilateral medial longitudinal arch  Vascular: Dorsalis Pedis artery and Posterior Tibial artery pedal pulses are 0/4 bilateral with slightly delayed capillary fill time. Pedal hair growth present more on the left than the right. No varicosities and no lower extremity edema present bilateral.   Neruologic: Grossly intact via light touch bilateral.   Musculoskeletal: No gross boney pedal deformities bilateral. No pain, crepitus, or  limitation noted with foot and ankle range of motion bilateral. Muscular strength 5/5 in all groups tested bilateral.  Plantar fibromas along the medial aspect of the plantar medial plantar fascia bilaterally it appears to be more than 1 nodule per foot.  She has tenderness on palpation and range of motion of the second metatarsophalangeal joint but no significant hammertoe deformity.  There is no swelling no erythema edema associated with this.  Gait: Unassisted, Nonantalgic.    Radiographs:  None taken  Assessment & Plan:   Assessment: Peripheral vascular disease most likely her predominant issue.  Idiopathic neuropathy diagnosed by neurology.  Plantar fibromatosis not injected.  Plan: Discussed etiology pathology conservative surgical therapies at this point I feel the smartest thing to do is to await the findings of the vascular surgeons as well as the MRI.  Once these findings have been reviewed and any vascular problems have been corrected I feel that we can go ahead and treat the fibromas.  I really feel that a large majority of her symptomatology will resolve with better blood flow.     Syenna Nazir T. [place], [place]

## 2021-10-18 ENCOUNTER — Other Ambulatory Visit (INDEPENDENT_AMBULATORY_CARE_PROVIDER_SITE_OTHER): Payer: Self-pay | Admitting: Vascular Surgery

## 2021-10-18 DIAGNOSIS — M79605 Pain in left leg: Secondary | ICD-10-CM

## 2021-10-21 ENCOUNTER — Ambulatory Visit (INDEPENDENT_AMBULATORY_CARE_PROVIDER_SITE_OTHER): Payer: Medicare Other | Admitting: Vascular Surgery

## 2021-10-21 ENCOUNTER — Ambulatory Visit (INDEPENDENT_AMBULATORY_CARE_PROVIDER_SITE_OTHER): Payer: Medicare Other

## 2021-10-21 ENCOUNTER — Ambulatory Visit
Admission: RE | Admit: 2021-10-21 | Discharge: 2021-10-21 | Disposition: A | Discharge status: Home / Self Care | Payer: Medicare Other | Source: Ambulatory Visit | Attending: Podiatry | Admitting: Podiatry

## 2021-10-21 ENCOUNTER — Encounter (INDEPENDENT_AMBULATORY_CARE_PROVIDER_SITE_OTHER): Payer: Self-pay | Admitting: Vascular Surgery

## 2021-10-21 VITALS — BP 190/78 | HR 63 | Resp 16 | Wt 139.4 lb

## 2021-10-21 DIAGNOSIS — I10 Essential (primary) hypertension: Secondary | ICD-10-CM

## 2021-10-21 DIAGNOSIS — M79605 Pain in left leg: Secondary | ICD-10-CM | POA: Diagnosis not present

## 2021-10-21 DIAGNOSIS — M79604 Pain in right leg: Secondary | ICD-10-CM

## 2021-10-21 DIAGNOSIS — M48061 Spinal stenosis, lumbar region without neurogenic claudication: Secondary | ICD-10-CM | POA: Diagnosis not present

## 2021-10-21 DIAGNOSIS — M25871 Other specified joint disorders, right ankle and foot: Secondary | ICD-10-CM | POA: Diagnosis present

## 2021-10-21 DIAGNOSIS — I739 Peripheral vascular disease, unspecified: Secondary | ICD-10-CM

## 2021-10-21 DIAGNOSIS — E782 Mixed hyperlipidemia: Secondary | ICD-10-CM

## 2021-10-21 DIAGNOSIS — M79606 Pain in leg, unspecified: Secondary | ICD-10-CM | POA: Insufficient documentation

## 2021-10-21 NOTE — Progress Notes (Signed)
MRN : [id number]  Elizabeth Li is a 66 y.o. (1956-03-12) female who presents with chief complaint of check circulation.  History of Present Illness:  The patient is seen for evaluation of painful lower extremities. Patient notes the pain is variable and almost always associated with activity.  She describes it as a numbness or heaviness "like lead weights".  The pain is somewhat consistent day to day occurring on most days. The patient notes the pain also occurs with standing and routinely seems worse as the day wears on. The pain has been progressive over the past several years. The patient states these symptoms are causing  a negative impact on quality of life and daily activities which was a factor in the referral.  The patient has a  history of back problems and DJD of the lumbar spine. She has had two epidural injections with some results  The patient denies rest pain or dangling of an extremity off the side of the bed during the night for relief. No open wounds or sores at this time. No history of DVT or phlebitis. No prior vascular interventions or surgeries.   ABI's Rt=0.95 and Lt=0.98 Triphasic signals in the PT bilaterally  No outpatient medications have been marked as taking for the 10/21/21 encounter (Appointment) with Delana Meyer, Dolores Lory, MD.    Past Medical History:  Diagnosis Date   Arthritis    Benign essential HTN 11/20/2016   Chest pain 11/20/2016   Diverticulitis    History of frequent urinary tract infections    Hypercholesterolemia    Hyperlipidemia 11/20/2016   Hypertension    Wears glasses     Past Surgical History:  Procedure Laterality Date   COLONOSCOPY  07/15/2008   polyps-done in [place] N/A 11/24/2014   Dr. Gala Romney: colonic diverticulosis, melanosis coli, surveillance in 2021   [place] N/A 10/08/2015   Procedure: LAPAROSCOPIC PARTIAL COLECTOMY;  Surgeon: Excell Seltzer, MD;   Location: [facility];  Service: General;  Laterality: N/A;   TONSILLECTOMY     TRIGGER FINGER RELEASE Right 02/27/2015   Procedure: RELEASE TRIGGER FINGER/A-1 PULLEY RIGHT LONG FINGER;  Surgeon: Leanora Cover, MD;  Location: [facility];  Service: Orthopedics;  Laterality: Right;    Social History Social History   Tobacco Use   Smoking status: Never   Smokeless tobacco: Never  Substance Use Topics   Alcohol use: Yes    Alcohol/week: 3.0 standard drinks of alcohol    Types: 3 Glasses of wine per week    Comment: 3-4 per week   Drug use: No    Family History Family History  Problem Relation Age of Onset   Arthritis Mother    Heart disease Mother    Arthritis Father    Heart disease Father    CVA Father    CAD Father        multi vessel   Heart disease Brother    Colon cancer Neg Hx     Allergies  Allergen Reactions   Flagyl [Metronidazole] Other (See Comments)    Causes weakness and dizziness      REVIEW OF SYSTEMS (Negative unless checked)  Constitutional: '[]'$ Weight loss  '[]'$ Fever  '[]'$ Chills Cardiac: '[]'$ Chest pain   '[]'$ Chest pressure   '[]'$ Palpitations   '[]'$ Shortness of breath when laying flat   '[]'$ Shortness of breath with exertion. Vascular:  '[x]'$ Pain  in legs with walking   '[]'$ Pain in legs at rest  '[]'$ History of DVT   '[]'$ Phlebitis   '[]'$ Swelling in legs   '[]'$ Varicose veins   '[]'$ Non-healing ulcers Pulmonary:   '[]'$ Uses home oxygen   '[]'$ Productive cough   '[]'$ Hemoptysis   '[]'$ Wheeze  '[]'$ COPD   '[]'$ Asthma Neurologic:  '[]'$ Dizziness   '[]'$ Seizures   '[]'$ History of stroke   '[]'$ History of TIA  '[]'$ Aphasia   '[]'$ Vissual changes   '[]'$ Weakness or numbness in arm   '[x]'$ Weakness or numbness in leg Musculoskeletal:   '[]'$ Joint swelling   '[]'$ Joint pain   '[]'$ Low back pain Hematologic:  '[]'$ Easy bruising  '[]'$ Easy bleeding   '[]'$ Hypercoagulable state   '[]'$ Anemic Gastrointestinal:  '[]'$ Diarrhea   '[]'$ Vomiting  '[]'$ Gastroesophageal reflux/heartburn   '[]'$ Difficulty swallowing. Genitourinary:  '[]'$ Chronic kidney disease   '[]'$ Difficult  urination  '[]'$ Frequent urination   '[]'$ Blood in urine Skin:  '[]'$ Rashes   '[]'$ Ulcers  Psychological:  '[]'$ History of anxiety   '[]'$  History of major depression.  Physical Examination  There were no vitals filed for this visit. There is no height or weight on file to calculate BMI. Gen: WD/WN, NAD Head: [place]/AT, No temporalis wasting.  Ear/Nose/Throat: Hearing grossly intact, nares w/o erythema or drainage Eyes: PER, EOMI, sclera nonicteric.  Neck: Supple, no masses.  No bruit or JVD.  Pulmonary:  Good air movement, no audible wheezing, no use of accessory muscles.  Cardiac: RRR, normal S1, S2, no Murmurs. Vascular:  mild trophic changes, no open wounds Vessel Right Left  Radial Palpable Palpable  PT Not Palpable Not Palpable  DP Not Palpable Not Palpable  Gastrointestinal: soft, non-distended. No guarding/no peritoneal signs.  Musculoskeletal: M/S 5/5 throughout.  No visible deformity.  Neurologic: CN 2-12 intact. Pain and light touch intact in extremities.  Symmetrical.  Speech is fluent. Motor exam as listed above. Psychiatric: Judgment intact, Mood & affect appropriate for pt's clinical situation. Dermatologic: No rashes or ulcers noted.  No changes consistent with cellulitis.   CBC Lab Results  Component Value Date   WBC 6.6 10/16/2018   HGB 14.1 10/16/2018   HCT 40.5 10/16/2018   MCV 88.6 10/16/2018   PLT 290 10/16/2018    BMET    Component Value Date/Time   NA 136 10/17/2018 0821   K 4.0 10/17/2018 0821   CL 103 10/17/2018 0821   CO2 24 10/17/2018 0821   GLUCOSE 113 (H) 10/17/2018 0821   BUN 9 10/17/2018 0821   CREATININE 0.59 10/17/2018 0821   CALCIUM 9.2 10/17/2018 0821   GFRNONAA >60 10/17/2018 0821   GFRAA >60 10/17/2018 0821   CrCl cannot be calculated (Patient's most recent lab result is older than the maximum 21 days allowed.).  COAG No results found for: "INR", "PROTIME"  Radiology No results found.   Assessment/Plan 1. Bilateral leg  pain Recommend:  The patient has experienced increased leg pain symptoms and is now describing lifestyle limiting symptoms and appears to be having mild rest pain symptroms.  Given the severity of the patient's bilateral lower extremity symptoms the patient should undergo CT angiography to see if intervention would be indicated.  Risk and benefits were reviewed the patient.  Indications for the procedure were reviewed.  All questions were answered, the patient agrees to proceed.  The patient should continue walking and begin a more formal exercise program.  The patient should continue antiplatelet therapy and aggressive treatment of the lipid abnormalities  The patient will follow up with me after the CT angiogram.    - VAS [place] ABI  WITH/WO TBI - CT ANGIO AO+BIFEM W & OR WO CONTRAST; Future  2. Pain in both lower extremities See #1  3. PAD (peripheral artery disease) (HCC) Recommend:  The patient has experienced increased leg pain symptoms and is now describing lifestyle limiting symptoms and appears to be having mild rest pain symptroms.  Given the severity of the patient's bilateral lower extremity symptoms the patient should undergo CT angiography to see if intervention would be indicated.  Risk and benefits were reviewed the patient.  Indications for the procedure were reviewed.  All questions were answered, the patient agrees to proceed.  The patient should continue walking and begin a more formal exercise program.  The patient should continue antiplatelet therapy and aggressive treatment of the lipid abnormalities  The patient will follow up with me after the CT angiogram.   - CT ANGIO AO+BIFEM W & OR WO CONTRAST; Future  4. Spinal stenosis of lumbar region, unspecified whether neurogenic claudication present  Recommend:  The patient has atypical pain symptoms for pure atherosclerotic disease. However, on physical exam there is evidence of vascular disease, given the diminished  pulses of the legs.  Further investigation of the patient's vascular disease is necessary to determine the relationship of the patient's lower extremity symptoms and the degree of vascular disease.  CT angiogram will be obtained and the patient will follow up with me to review these studies.  I suspect the patient is c/o pseudoclaudication.  Patient should have an evaluation of his LS spine which I defer to either the primary service or the Spine service.  The patient should continue walking and begin a more formal exercise program. The patient should continue his antiplatelet therapy and aggressive treatment of the lipid abnormalities.   5. Benign essential HTN Continue antihypertensive medications as already ordered, these medications have been reviewed and there are no changes at this time.   6. Mixed hyperlipidemia Continue statin as ordered and reviewed, no changes at this time   7. Bilateral leg pain See #1 - VAS [place] ABI WITH/WO TBI    [name], MD  10/21/2021 9:25 AM

## 2021-10-24 ENCOUNTER — Ambulatory Visit
Admission: RE | Admit: 2021-10-24 | Discharge: 2021-10-24 | Disposition: A | Discharge status: Home / Self Care | Payer: Medicare Other | Source: Ambulatory Visit | Attending: Vascular Surgery | Admitting: Vascular Surgery

## 2021-10-24 DIAGNOSIS — I739 Peripheral vascular disease, unspecified: Secondary | ICD-10-CM | POA: Diagnosis present

## 2021-10-24 DIAGNOSIS — M79605 Pain in left leg: Secondary | ICD-10-CM | POA: Diagnosis present

## 2021-10-24 DIAGNOSIS — M79604 Pain in right leg: Secondary | ICD-10-CM | POA: Diagnosis present

## 2021-10-24 LAB — POCT I-STAT CREATININE: Creatinine, Ser: 0.7 mg/dL (ref 0.44–1.00)

## 2021-10-24 MED ORDER — IOHEXOL 350 MG/ML SOLN
100.0000 mL | Freq: Once | INTRAVENOUS | Status: AC | PRN
  Administered 2021-10-24: 100 mL via INTRAVENOUS

## 2021-10-31 ENCOUNTER — Encounter (INDEPENDENT_AMBULATORY_CARE_PROVIDER_SITE_OTHER): Payer: Self-pay | Admitting: Vascular Surgery

## 2021-10-31 ENCOUNTER — Ambulatory Visit (INDEPENDENT_AMBULATORY_CARE_PROVIDER_SITE_OTHER): Payer: Medicare Other | Admitting: Vascular Surgery

## 2021-10-31 VITALS — BP 172/76 | HR 73 | Resp 17 | Ht 62.0 in | Wt 140.4 lb

## 2021-10-31 DIAGNOSIS — I739 Peripheral vascular disease, unspecified: Secondary | ICD-10-CM

## 2021-10-31 DIAGNOSIS — I10 Essential (primary) hypertension: Secondary | ICD-10-CM | POA: Diagnosis not present

## 2021-10-31 DIAGNOSIS — M48061 Spinal stenosis, lumbar region without neurogenic claudication: Secondary | ICD-10-CM

## 2021-10-31 DIAGNOSIS — E782 Mixed hyperlipidemia: Secondary | ICD-10-CM | POA: Diagnosis not present

## 2021-10-31 NOTE — H&P (View-Only) (Signed)
MRN : [id number]  Elizabeth Li is a 66 y.o. (06/21/1955) female who presents with chief complaint of check circulation.  History of Present Illness:   The patient returns to the office for followup and review of the CT angiogram dated 10/24/2021.   The patient notes severe symptoms in the the right lower extremity.  The patient notes that her claudication distance and development is very short and she is describing mild rest pain symptoms. No new ulcers or wounds have occurred since the last visit.  There have been no significant changes to the patient's overall health care.  The patient denies amaurosis fugax or recent TIA symptoms. There are no recent neurological changes noted. There is no history of DVT, PE or superficial thrombophlebitis. The patient denies recent episodes of angina or shortness of breath.   CT angiogram is reviewed by me personally with the patient and shows a focal greater than 90% stenosis of the right mid SFA mild calcific changes are noted throughout the arterial system but these are quite minimal and not hemodynamically significant there appears to be three-vessel runoff bilaterally.  Incidental notation of a calcific plaque at the origin of the right renal artery.   Current Meds  Medication Sig   aspirin EC 81 MG tablet Take 81 mg by mouth daily.   FLUoxetine (PROZAC) 20 MG capsule Take 20 mg by mouth daily.   Glucosamine-Chondroitin (GLUCOSAMINE CHONDR COMPLEX PO) Take 2 tablets by mouth daily.   losartan (COZAAR) 100 MG tablet Take 100 mg by mouth daily.   Multiple Vitamin (MULTIVITAMIN WITH MINERALS) TABS tablet Take 1 tablet by mouth daily.   omeprazole (PRILOSEC) 20 MG capsule Take 20 mg by mouth daily.   simvastatin (ZOCOR) 40 MG tablet Take 40 mg by mouth daily.   VITAMIN D, CHOLECALCIFEROL, PO Take 1 tablet by mouth daily.    Past Medical History:  Diagnosis Date   Arthritis    Benign essential HTN 11/20/2016   Chest pain  11/20/2016   Diverticulitis    History of frequent urinary tract infections    Hypercholesterolemia    Hyperlipidemia 11/20/2016   Hypertension    Wears glasses     Past Surgical History:  Procedure Laterality Date   COLONOSCOPY  07/15/2008   polyps-done in [place] N/A 11/24/2014   Dr. Gala Romney: colonic diverticulosis, melanosis coli, surveillance in 2021   [place] N/A 10/08/2015   Procedure: LAPAROSCOPIC PARTIAL COLECTOMY;  Surgeon: Excell Seltzer, MD;  Location: [facility];  Service: General;  Laterality: N/A;   TONSILLECTOMY     TRIGGER FINGER RELEASE Right 02/27/2015   Procedure: RELEASE TRIGGER FINGER/A-1 PULLEY RIGHT LONG FINGER;  Surgeon: Leanora Cover, MD;  Location: [facility];  Service: Orthopedics;  Laterality: Right;    Social History Social History   Tobacco Use   Smoking status: Never   Smokeless tobacco: Never  Substance Use Topics   Alcohol use: Yes    Alcohol/week: 3.0 standard drinks of alcohol    Types: 3 Glasses of wine per week    Comment: 3-4 per week   Drug use: No    Family History Family History  Problem Relation Age of Onset   Arthritis Mother    Heart disease Mother    Arthritis Father    Heart disease Father    CVA Father    CAD Father  multi vessel   Heart disease Brother    Colon cancer Neg Hx     Allergies  Allergen Reactions   Flagyl [Metronidazole] Other (See Comments)    Causes weakness and dizziness      REVIEW OF SYSTEMS (Negative unless checked)  Constitutional: '[]'$ Weight loss  '[]'$ Fever  '[]'$ Chills Cardiac: '[]'$ Chest pain   '[]'$ Chest pressure   '[]'$ Palpitations   '[]'$ Shortness of breath when laying flat   '[]'$ Shortness of breath with exertion. Vascular:  '[x]'$ Pain in legs with walking   '[]'$ Pain in legs at rest  '[]'$ History of DVT   '[]'$ Phlebitis   '[]'$ Swelling in legs   '[]'$ Varicose veins   '[]'$ Non-healing ulcers Pulmonary:   '[]'$ Uses home oxygen   '[]'$ Productive cough   '[]'$ Hemoptysis    '[]'$ Wheeze  '[]'$ COPD   '[]'$ Asthma Neurologic:  '[]'$ Dizziness   '[]'$ Seizures   '[]'$ History of stroke   '[]'$ History of TIA  '[]'$ Aphasia   '[]'$ Vissual changes   '[]'$ Weakness or numbness in arm   '[]'$ Weakness or numbness in leg Musculoskeletal:   '[]'$ Joint swelling   '[]'$ Joint pain   '[]'$ Low back pain Hematologic:  '[]'$ Easy bruising  '[]'$ Easy bleeding   '[]'$ Hypercoagulable state   '[]'$ Anemic Gastrointestinal:  '[]'$ Diarrhea   '[]'$ Vomiting  '[]'$ Gastroesophageal reflux/heartburn   '[]'$ Difficulty swallowing. Genitourinary:  '[]'$ Chronic kidney disease   '[]'$ Difficult urination  '[]'$ Frequent urination   '[]'$ Blood in urine Skin:  '[]'$ Rashes   '[]'$ Ulcers  Psychological:  '[]'$ History of anxiety   '[]'$  History of major depression.  Physical Examination  Vitals:   10/31/21 1330  BP: (!) 172/76  Pulse: 73  Resp: 17  Weight: 140 lb 6.4 oz (63.7 kg)  Height: '5\' 2"'$  (1.575 m)   Body mass index is 25.68 kg/m. Gen: WD/WN, NAD Head: [place]/AT, No temporalis wasting.  Ear/Nose/Throat: Hearing grossly intact, nares w/o erythema or drainage Eyes: PER, EOMI, sclera nonicteric.  Neck: Supple, no masses.  No bruit or JVD.  Pulmonary:  Good air movement, no audible wheezing, no use of accessory muscles.  Cardiac: RRR, normal S1, S2, no Murmurs. Vascular:  No open wounds Vessel Right Left  Radial Palpable Palpable  PT Not Palpable Trace Palpable  DP Not Palpable Trace Palpable  Gastrointestinal: soft, non-distended. No guarding/no peritoneal signs.  Musculoskeletal: M/S 5/5 throughout.  No visible deformity.  Neurologic: CN 2-12 intact. Pain and light touch intact in extremities.  Symmetrical.  Speech is fluent. Motor exam as listed above. Psychiatric: Judgment intact, Mood & affect appropriate for pt's clinical situation. Dermatologic: No rashes or ulcers noted.  No changes consistent with cellulitis.   CBC Lab Results  Component Value Date   WBC 6.6 10/16/2018   HGB 14.1 10/16/2018   HCT 40.5 10/16/2018   MCV 88.6 10/16/2018   PLT 290 10/16/2018    BMET     Component Value Date/Time   NA 136 10/17/2018 0821   K 4.0 10/17/2018 0821   CL 103 10/17/2018 0821   CO2 24 10/17/2018 0821   GLUCOSE 113 (H) 10/17/2018 0821   BUN 9 10/17/2018 0821   CREATININE 0.70 10/24/2021 0833   CALCIUM 9.2 10/17/2018 0821   GFRNONAA >60 10/17/2018 0821   GFRAA >60 10/17/2018 2951   Estimated Creatinine Clearance: 59.8 mL/min (by C-G formula based on SCr of 0.7 mg/dL).  COAG No results found for: "INR", "PROTIME"  Radiology VAS [place] ABI WITH/WO TBI  Result Date: 10/24/2021  LOWER EXTREMITY DOPPLER STUDY Patient Name:  MODINE OPPENHEIMER  Date of Exam:   10/21/2021 Medical Rec #: [id number]  Accession #:    [id number] Date of Birth: [birth year]           Patient Gender: F Patient Age:   38 years Exam Location:  [facility] Procedure:      VAS [place] ABI WITH/WO TBI Referring Phys: [name] --------------------------------------------------------------------------------  Indications: Claudication, and rest pain.  Performing Technologist: Almira Coaster RVS  Examination Guidelines: A complete evaluation includes at minimum, Doppler waveform signals and systolic blood pressure reading at the level of bilateral brachial, anterior tibial, and posterior tibial arteries, when vessel segments are accessible. Bilateral testing is considered an integral part of a complete examination. Photoelectric Plethysmograph (PPG) waveforms and toe systolic pressure readings are included as required and additional duplex testing as needed. Limited examinations for reoccurring indications may be performed as noted.  ABI Findings: +---------+------------------+-----+---------+--------+ Right    Rt Pressure (mmHg)IndexWaveform Comment  +---------+------------------+-----+---------+--------+ Brachial 188                                      +---------+------------------+-----+---------+--------+ ATA      169               0.90 triphasic          +---------+------------------+-----+---------+--------+ PTA      178               0.95 triphasic         +---------+------------------+-----+---------+--------+ Great Toe134               0.71 Normal            +---------+------------------+-----+---------+--------+ +---------+------------------+-----+--------+----------------+ Left     Lt Pressure (mmHg)IndexWaveformComment          +---------+------------------+-----+--------+----------------+ Brachial 155                            Subclavian Steal +---------+------------------+-----+--------+----------------+ ATA      184               0.98 biphasic                 +---------+------------------+-----+--------+----------------+ PTA      175               0.93 biphasic                 +---------+------------------+-----+--------+----------------+ PERO     174               0.93 biphasic                 +---------+------------------+-----+--------+----------------+ Great Toe121               0.64 Abnormal                 +---------+------------------+-----+--------+----------------+ +-------+-----------+-----------+------------+------------+ ABI/TBIToday's ABIToday's TBIPrevious ABIPrevious TBI +-------+-----------+-----------+------------+------------+ Right  .95        .71                                 +-------+-----------+-----------+------------+------------+ Left   .98        .64                                 +-------+-----------+-----------+------------+------------+  Summary: Right: Resting right ankle-brachial index is within normal range. No  evidence of significant right lower extremity arterial disease. The right toe-brachial index is normal. Left: Resting left ankle-brachial index is within normal range. No evidence of significant left lower extremity arterial disease. The left toe-brachial index is abnormal. *See table(s) above for measurements and observations.   Electronically  signed by [name] MD on 10/24/2021 at 4:14:55 PM.    Final    CT ANGIO AO+BIFEM W & OR WO CONTRAST  Result Date: 10/24/2021 CLINICAL DATA:  66 year old female with bilateral lower extremity pain possibly representing claudication, normal ankle-brachial indices. EXAM: CT ANGIOGRAPHY OF ABDOMINAL AORTA WITH ILIOFEMORAL RUNOFF TECHNIQUE: Multidetector CT imaging of the abdomen, pelvis and lower extremities was performed using the standard protocol during bolus administration of intravenous contrast. Multiplanar CT image reconstructions and MIPs were obtained to evaluate the vascular anatomy. RADIATION DOSE REDUCTION: This exam was performed according to the departmental dose-optimization program which includes automated exposure control, adjustment of the mA and/or kV according to patient size and/or use of iterative reconstruction technique. CONTRAST:  130m OMNIPAQUE IOHEXOL 350 MG/ML SOLN COMPARISON:  CT abdomen pelvis from 07/09/2015 FINDINGS: VASCULAR Aorta: Normal caliber aorta without aneurysm, dissection, vasculitis or significant stenosis. Mild scattered atherosclerotic calcifications. Celiac: Severe ostial stenosis secondary to atherosclerotic plaque and possibly degree of median arcuate ligament compression. Patent distally. SMA: Patent without evidence of aneurysm, dissection, vasculitis or significant stenosis. Renals: Single bilateral renal arteries are patent with mild bilateral ostial stenosis secondary to atherosclerotic plaques. IMA: Patent without evidence of aneurysm, dissection, vasculitis or significant stenosis. RIGHT Lower Extremity Inflow: Common, internal and external iliac arteries are patent without evidence of aneurysm, dissection, vasculitis or significant stenosis. Outflow: Focal severe stenosis in the mid superficial femoral artery without associated atherosclerotic calcification. The common and profundal femoral arteries are widely patent. The remaining superficial femoral  artery is widely patent. Runoff: Patent three vessel runoff to the ankle. LEFT Lower Extremity Inflow: Common, internal and external iliac arteries are patent without evidence of aneurysm, dissection, vasculitis or significant stenosis. Focal atherosclerotic calcification about the medial aspect of proximal common iliac artery without evidence of flow-limiting stenosis. Outflow: Common, superficial and profunda femoral arteries and the popliteal artery are patent without evidence of aneurysm, dissection, vasculitis or significant stenosis. Runoff: Patent three vessel runoff to the ankle. Veins: No obvious venous abnormality within the limitations of this arterial phase study. Review of the MIP images confirms the above findings. NON-VASCULAR Lower chest: No acute abnormality. Mild global cardiomegaly. No pericardial effusion. Hepatobiliary: No focal liver abnormality is seen. No gallstones, gallbladder wall thickening, or biliary dilatation. Pancreas: Unremarkable. No pancreatic ductal dilatation or surrounding inflammatory changes. Spleen: Normal in size without focal abnormality. Adrenals/Urinary Tract: Adrenal glands are unremarkable. Kidneys are normal, without renal calculi, focal lesion, or hydronephrosis. Bladder is unremarkable. Stomach/Bowel: Stomach is within normal limits. Appendix appears normal. And colonic diverticula, most prominent the descending and sigmoid colon. No surrounding inflammatory changes. No evidence of bowel wall thickening, distention, or inflammatory changes. Lymphatic: No abdominopelvic lymphadenopathy. Reproductive: Uterus and bilateral adnexa are unremarkable. Other: No abdominal wall hernia or abnormality. No abdominopelvic ascites. Musculoskeletal: No acute or significant osseous findings. IMPRESSION: VASCULAR 1. Mild aortoiliac atherosclerosis without evidence of flow-limiting stenosis. 2. Severe focal right mid superficial femoral artery stenosis without definite associated  atherosclerotic plaque. Otherwise widely patent right lower extremity inflow, outflow, and runoff vessels. 3. Widely patent left lower extremity inflow, outflow, and runoff vessels. NON-VASCULAR 1. No acute abdominopelvic abnormality. 2. Colonic diverticulosis. 3. Mild global cardiomegaly. DRuthann Cancer MD  [facility] Radiology Electronically Signed   By: Ruthann Cancer M.D.   On: 10/24/2021 15:52   MR FOOT RIGHT WO CONTRAST  Result Date: 10/22/2021 CLINICAL DATA:  Predislocation syndrome of metatarsophalangeal joint, right. Pain in right foot mainly are toes for 1 year with numbness and tingling. History of hammertoe and neuroma surgery. EXAM: MRI OF THE RIGHT FOREFOOT WITHOUT CONTRAST TECHNIQUE: Multiplanar, multisequence MR imaging of the right foot radiographs 03/24/2019 was performed. No intravenous contrast was administered. COMPARISON:  None Available. FINDINGS: Bones/Joint/Cartilage Moderate medial greater than lateral great toe metatarsophalangeal joint space narrowing and peripheral osteophytosis. Likely postsurgical changes of prior minimal partial resection of the distal aspect of the proximal phalanges of the fourth and fifth toes. Mild marrow edema within the lateral aspect of second metatarsal head with mild second metatarsophalangeal joint space narrowing and cartilage thinning. Mild approximate 3 mm medial subluxation of the second toe proximal phalanx with respect to the metatarsal head. Mild cartilage thinning and peripheral osteophytosis of the fourth tarsometatarsal joint. No cortical erosion is seen. No acute fracture or avascular necrosis. Ligaments The Lisfranc ligament complex is intact. Muscles and Tendons The visualized flexor and extensor tendons are intact. Normal signal and size of the regional musculature. Soft tissues Mild edema and swelling of the dorsal forefoot soft tissues at the level of second through fourth distal metatarsals.  IMPRESSION: 1. 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 2. Mild second metatarsophalangeal joint osteoarthritis with mild medial subluxation of the proximal phalanx with respect to the metatarsal head. 3. Likely postsurgical changes of prior minimal partial resection of the distal aspect of the proximal phalanges of the fourth and fifth toes. 4. Mild dorsal forefoot soft tissue swelling. Electronically Signed   By: Yvonne Kendall M.D.   On: 10/22/2021 08:33     Assessment/Plan 1. PAD (peripheral artery disease) (HCC) Recommend:  The patient has experienced increased claudication symptoms and is now describing lifestyle limiting claudication and appears to be having mild rest pain symptroms.  Given the severity of the patient's severe right lower extremity symptoms the patient should undergo angiography with the hope for intervention.  Risk and benefits were reviewed the patient.  Indications for the procedure were reviewed.  All questions were answered, the patient agrees to proceed with right lower extremity angiography and possible intervention.   The patient should continue walking and begin a more formal exercise program.  The patient should continue antiplatelet therapy and aggressive treatment of the lipid abnormalities  The patient will follow up with me after the angiogram.    2. Benign essential HTN Continue antihypertensive medications as already ordered, these medications have been reviewed and there are no changes at this time.      Incidental finding on CT scan is possible high grade stenosis of the origin of the right renal artery.  This will be further investigated with a duplex in the future.  3. Mixed hyperlipidemia Continue statin as ordered and reviewed, no changes at this time   4. Spinal stenosis of lumbar region, unspecified whether neurogenic claudication present Continue NSAID medications as already ordered, these medications have been  reviewed and there are no changes at this time.  The possibility that much of her foot complaints is related to her spinal disease was discussed too.  Continued activity and therapy was stressed.      [name], MD  10/31/2021 1:47 PM

## 2021-10-31 NOTE — Progress Notes (Signed)
MRN : [id number]  Elizabeth Li is a 66 y.o. (02-04-1956) female who presents with chief complaint of check circulation.  History of Present Illness:   The patient returns to the office for followup and review of the CT angiogram dated 10/24/2021.   The patient notes severe symptoms in the the right lower extremity.  The patient notes that her claudication distance and development is very short and she is describing mild rest pain symptoms. No new ulcers or wounds have occurred since the last visit.  There have been no significant changes to the patient's overall health care.  The patient denies amaurosis fugax or recent TIA symptoms. There are no recent neurological changes noted. There is no history of DVT, PE or superficial thrombophlebitis. The patient denies recent episodes of angina or shortness of breath.   CT angiogram is reviewed by me personally with the patient and shows a focal greater than 90% stenosis of the right mid SFA mild calcific changes are noted throughout the arterial system but these are quite minimal and not hemodynamically significant there appears to be three-vessel runoff bilaterally.  Incidental notation of a calcific plaque at the origin of the right renal artery.   Current Meds  Medication Sig   aspirin EC 81 MG tablet Take 81 mg by mouth daily.   FLUoxetine (PROZAC) 20 MG capsule Take 20 mg by mouth daily.   Glucosamine-Chondroitin (GLUCOSAMINE CHONDR COMPLEX PO) Take 2 tablets by mouth daily.   losartan (COZAAR) 100 MG tablet Take 100 mg by mouth daily.   Multiple Vitamin (MULTIVITAMIN WITH MINERALS) TABS tablet Take 1 tablet by mouth daily.   omeprazole (PRILOSEC) 20 MG capsule Take 20 mg by mouth daily.   simvastatin (ZOCOR) 40 MG tablet Take 40 mg by mouth daily.   VITAMIN D, CHOLECALCIFEROL, PO Take 1 tablet by mouth daily.    Past Medical History:  Diagnosis Date   Arthritis    Benign essential HTN 11/20/2016   Chest pain  11/20/2016   Diverticulitis    History of frequent urinary tract infections    Hypercholesterolemia    Hyperlipidemia 11/20/2016   Hypertension    Wears glasses     Past Surgical History:  Procedure Laterality Date   COLONOSCOPY  07/15/2008   polyps-done in [place] N/A 11/24/2014   Dr. Gala Romney: colonic diverticulosis, melanosis coli, surveillance in 2021   [place] N/A 10/08/2015   Procedure: LAPAROSCOPIC PARTIAL COLECTOMY;  Surgeon: Excell Seltzer, MD;  Location: [facility];  Service: General;  Laterality: N/A;   TONSILLECTOMY     TRIGGER FINGER RELEASE Right 02/27/2015   Procedure: RELEASE TRIGGER FINGER/A-1 PULLEY RIGHT LONG FINGER;  Surgeon: Leanora Cover, MD;  Location: [place];  Service: Orthopedics;  Laterality: Right;    Social History Social History   Tobacco Use   Smoking status: Never   Smokeless tobacco: Never  Substance Use Topics   Alcohol use: Yes    Alcohol/week: 3.0 standard drinks of alcohol    Types: 3 Glasses of wine per week    Comment: 3-4 per week   Drug use: No    Family History Family History  Problem Relation Age of Onset   Arthritis Mother    Heart disease Mother    Arthritis Father    Heart disease Father    CVA Father    CAD Father  multi vessel   Heart disease Brother    Colon cancer Neg Hx     Allergies  Allergen Reactions   Flagyl [Metronidazole] Other (See Comments)    Causes weakness and dizziness      REVIEW OF SYSTEMS (Negative unless checked)  Constitutional: '[]'$ Weight loss  '[]'$ Fever  '[]'$ Chills Cardiac: '[]'$ Chest pain   '[]'$ Chest pressure   '[]'$ Palpitations   '[]'$ Shortness of breath when laying flat   '[]'$ Shortness of breath with exertion. Vascular:  '[x]'$ Pain in legs with walking   '[]'$ Pain in legs at rest  '[]'$ History of DVT   '[]'$ Phlebitis   '[]'$ Swelling in legs   '[]'$ Varicose veins   '[]'$ Non-healing ulcers Pulmonary:   '[]'$ Uses home oxygen   '[]'$ Productive cough   '[]'$ Hemoptysis    '[]'$ Wheeze  '[]'$ COPD   '[]'$ Asthma Neurologic:  '[]'$ Dizziness   '[]'$ Seizures   '[]'$ History of stroke   '[]'$ History of TIA  '[]'$ Aphasia   '[]'$ Vissual changes   '[]'$ Weakness or numbness in arm   '[]'$ Weakness or numbness in leg Musculoskeletal:   '[]'$ Joint swelling   '[]'$ Joint pain   '[]'$ Low back pain Hematologic:  '[]'$ Easy bruising  '[]'$ Easy bleeding   '[]'$ Hypercoagulable state   '[]'$ Anemic Gastrointestinal:  '[]'$ Diarrhea   '[]'$ Vomiting  '[]'$ Gastroesophageal reflux/heartburn   '[]'$ Difficulty swallowing. Genitourinary:  '[]'$ Chronic kidney disease   '[]'$ Difficult urination  '[]'$ Frequent urination   '[]'$ Blood in urine Skin:  '[]'$ Rashes   '[]'$ Ulcers  Psychological:  '[]'$ History of anxiety   '[]'$  History of major depression.  Physical Examination  Vitals:   10/31/21 1330  BP: (!) 172/76  Pulse: 73  Resp: 17  Weight: 140 lb 6.4 oz (63.7 kg)  Height: '5\' 2"'$  (1.575 m)   Body mass index is 25.68 kg/m. Gen: WD/WN, NAD Head: [place]/AT, No temporalis wasting.  Ear/Nose/Throat: Hearing grossly intact, nares w/o erythema or drainage Eyes: PER, EOMI, sclera nonicteric.  Neck: Supple, no masses.  No bruit or JVD.  Pulmonary:  Good air movement, no audible wheezing, no use of accessory muscles.  Cardiac: RRR, normal S1, S2, no Murmurs. Vascular:  No open wounds Vessel Right Left  Radial Palpable Palpable  PT Not Palpable Trace Palpable  DP Not Palpable Trace Palpable  Gastrointestinal: soft, non-distended. No guarding/no peritoneal signs.  Musculoskeletal: M/S 5/5 throughout.  No visible deformity.  Neurologic: CN 2-12 intact. Pain and light touch intact in extremities.  Symmetrical.  Speech is fluent. Motor exam as listed above. Psychiatric: Judgment intact, Mood & affect appropriate for pt's clinical situation. Dermatologic: No rashes or ulcers noted.  No changes consistent with cellulitis.   CBC Lab Results  Component Value Date   WBC 6.6 10/16/2018   HGB 14.1 10/16/2018   HCT 40.5 10/16/2018   MCV 88.6 10/16/2018   PLT 290 10/16/2018    BMET     Component Value Date/Time   NA 136 10/17/2018 0821   K 4.0 10/17/2018 0821   CL 103 10/17/2018 0821   CO2 24 10/17/2018 0821   GLUCOSE 113 (H) 10/17/2018 0821   BUN 9 10/17/2018 0821   CREATININE 0.70 10/24/2021 0833   CALCIUM 9.2 10/17/2018 0821   GFRNONAA >60 10/17/2018 0821   GFRAA >60 10/17/2018 1610   Estimated Creatinine Clearance: 59.8 mL/min (by C-G formula based on SCr of 0.7 mg/dL).  COAG No results found for: "INR", "PROTIME"  Radiology VAS [place] ABI WITH/WO TBI  Result Date: 10/24/2021  LOWER EXTREMITY DOPPLER STUDY Patient Name:  Elizabeth Li  Date of Exam:   10/21/2021 Medical Rec #: [id number]  Accession #:    [id number] Date of Birth: [birth year]           Patient Gender: F Patient Age:   81 years Exam Location:  [facility] Procedure:      VAS [place] ABI WITH/WO TBI Referring Phys: [name] --------------------------------------------------------------------------------  Indications: Claudication, and rest pain.  Performing Technologist: Almira Coaster RVS  Examination Guidelines: A complete evaluation includes at minimum, Doppler waveform signals and systolic blood pressure reading at the level of bilateral brachial, anterior tibial, and posterior tibial arteries, when vessel segments are accessible. Bilateral testing is considered an integral part of a complete examination. Photoelectric Plethysmograph (PPG) waveforms and toe systolic pressure readings are included as required and additional duplex testing as needed. Limited examinations for reoccurring indications may be performed as noted.  ABI Findings: +---------+------------------+-----+---------+--------+ Right    Rt Pressure (mmHg)IndexWaveform Comment  +---------+------------------+-----+---------+--------+ Brachial 188                                      +---------+------------------+-----+---------+--------+ ATA      169               0.90 triphasic          +---------+------------------+-----+---------+--------+ PTA      178               0.95 triphasic         +---------+------------------+-----+---------+--------+ Great Toe134               0.71 Normal            +---------+------------------+-----+---------+--------+ +---------+------------------+-----+--------+----------------+ Left     Lt Pressure (mmHg)IndexWaveformComment          +---------+------------------+-----+--------+----------------+ Brachial 155                            Subclavian Steal +---------+------------------+-----+--------+----------------+ ATA      184               0.98 biphasic                 +---------+------------------+-----+--------+----------------+ PTA      175               0.93 biphasic                 +---------+------------------+-----+--------+----------------+ PERO     174               0.93 biphasic                 +---------+------------------+-----+--------+----------------+ Great Toe121               0.64 Abnormal                 +---------+------------------+-----+--------+----------------+ +-------+-----------+-----------+------------+------------+ ABI/TBIToday's ABIToday's TBIPrevious ABIPrevious TBI +-------+-----------+-----------+------------+------------+ Right  .95        .71                                 +-------+-----------+-----------+------------+------------+ Left   .98        .64                                 +-------+-----------+-----------+------------+------------+  Summary: Right: Resting right ankle-brachial index is within normal range. No  evidence of significant right lower extremity arterial disease. The right toe-brachial index is normal. Left: Resting left ankle-brachial index is within normal range. No evidence of significant left lower extremity arterial disease. The left toe-brachial index is abnormal. *See table(s) above for measurements and observations.   Electronically  signed by [name] MD on 10/24/2021 at 4:14:55 PM.    Final    CT ANGIO AO+BIFEM W & OR WO CONTRAST  Result Date: 10/24/2021 CLINICAL DATA:  66 year old female with bilateral lower extremity pain possibly representing claudication, normal ankle-brachial indices. EXAM: CT ANGIOGRAPHY OF ABDOMINAL AORTA WITH ILIOFEMORAL RUNOFF TECHNIQUE: Multidetector CT imaging of the abdomen, pelvis and lower extremities was performed using the standard protocol during bolus administration of intravenous contrast. Multiplanar CT image reconstructions and MIPs were obtained to evaluate the vascular anatomy. RADIATION DOSE REDUCTION: This exam was performed according to the departmental dose-optimization program which includes automated exposure control, adjustment of the mA and/or kV according to patient size and/or use of iterative reconstruction technique. CONTRAST:  178m OMNIPAQUE IOHEXOL 350 MG/ML SOLN COMPARISON:  CT abdomen pelvis from 07/09/2015 FINDINGS: VASCULAR Aorta: Normal caliber aorta without aneurysm, dissection, vasculitis or significant stenosis. Mild scattered atherosclerotic calcifications. Celiac: Severe ostial stenosis secondary to atherosclerotic plaque and possibly degree of median arcuate ligament compression. Patent distally. SMA: Patent without evidence of aneurysm, dissection, vasculitis or significant stenosis. Renals: Single bilateral renal arteries are patent with mild bilateral ostial stenosis secondary to atherosclerotic plaques. IMA: Patent without evidence of aneurysm, dissection, vasculitis or significant stenosis. RIGHT Lower Extremity Inflow: Common, internal and external iliac arteries are patent without evidence of aneurysm, dissection, vasculitis or significant stenosis. Outflow: Focal severe stenosis in the mid superficial femoral artery without associated atherosclerotic calcification. The common and profundal femoral arteries are widely patent. The remaining superficial femoral  artery is widely patent. Runoff: Patent three vessel runoff to the ankle. LEFT Lower Extremity Inflow: Common, internal and external iliac arteries are patent without evidence of aneurysm, dissection, vasculitis or significant stenosis. Focal atherosclerotic calcification about the medial aspect of proximal common iliac artery without evidence of flow-limiting stenosis. Outflow: Common, superficial and profunda femoral arteries and the popliteal artery are patent without evidence of aneurysm, dissection, vasculitis or significant stenosis. Runoff: Patent three vessel runoff to the ankle. Veins: No obvious venous abnormality within the limitations of this arterial phase study. Review of the MIP images confirms the above findings. NON-VASCULAR Lower chest: No acute abnormality. Mild global cardiomegaly. No pericardial effusion. Hepatobiliary: No focal liver abnormality is seen. No gallstones, gallbladder wall thickening, or biliary dilatation. Pancreas: Unremarkable. No pancreatic ductal dilatation or surrounding inflammatory changes. Spleen: Normal in size without focal abnormality. Adrenals/Urinary Tract: Adrenal glands are unremarkable. Kidneys are normal, without renal calculi, focal lesion, or hydronephrosis. Bladder is unremarkable. Stomach/Bowel: Stomach is within normal limits. Appendix appears normal. And colonic diverticula, most prominent the descending and sigmoid colon. No surrounding inflammatory changes. No evidence of bowel wall thickening, distention, or inflammatory changes. Lymphatic: No abdominopelvic lymphadenopathy. Reproductive: Uterus and bilateral adnexa are unremarkable. Other: No abdominal wall hernia or abnormality. No abdominopelvic ascites. Musculoskeletal: No acute or significant osseous findings. IMPRESSION: VASCULAR 1. Mild aortoiliac atherosclerosis without evidence of flow-limiting stenosis. 2. Severe focal right mid superficial femoral artery stenosis without definite associated  atherosclerotic plaque. Otherwise widely patent right lower extremity inflow, outflow, and runoff vessels. 3. Widely patent left lower extremity inflow, outflow, and runoff vessels. NON-VASCULAR 1. No acute abdominopelvic abnormality. 2. Colonic diverticulosis. 3. Mild global cardiomegaly. DRuthann Cancer MD  [facility] Radiology Electronically Signed   By: Ruthann Cancer M.D.   On: 10/24/2021 15:52   MR FOOT RIGHT WO CONTRAST  Result Date: 10/22/2021 CLINICAL DATA:  Predislocation syndrome of metatarsophalangeal joint, right. Pain in right foot mainly are toes for 1 year with numbness and tingling. History of hammertoe and neuroma surgery. EXAM: MRI OF THE RIGHT FOREFOOT WITHOUT CONTRAST TECHNIQUE: Multiplanar, multisequence MR imaging of the right foot radiographs 03/24/2019 was performed. No intravenous contrast was administered. COMPARISON:  None Available. FINDINGS: Bones/Joint/Cartilage Moderate medial greater than lateral great toe metatarsophalangeal joint space narrowing and peripheral osteophytosis. Likely postsurgical changes of prior minimal partial resection of the distal aspect of the proximal phalanges of the fourth and fifth toes. Mild marrow edema within the lateral aspect of second metatarsal head with mild second metatarsophalangeal joint space narrowing and cartilage thinning. Mild approximate 3 mm medial subluxation of the second toe proximal phalanx with respect to the metatarsal head. Mild cartilage thinning and peripheral osteophytosis of the fourth tarsometatarsal joint. No cortical erosion is seen. No acute fracture or avascular necrosis. Ligaments The Lisfranc ligament complex is intact. Muscles and Tendons The visualized flexor and extensor tendons are intact. Normal signal and size of the regional musculature. Soft tissues Mild edema and swelling of the dorsal forefoot soft tissues at the level of second through fourth distal metatarsals.  IMPRESSION: 1. 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 2. Mild second metatarsophalangeal joint osteoarthritis with mild medial subluxation of the proximal phalanx with respect to the metatarsal head. 3. Likely postsurgical changes of prior minimal partial resection of the distal aspect of the proximal phalanges of the fourth and fifth toes. 4. Mild dorsal forefoot soft tissue swelling. Electronically Signed   By: Yvonne Kendall M.D.   On: 10/22/2021 08:33     Assessment/Plan There are no diagnoses linked to this encounter.    [name], MD  10/31/2021 1:47 PM

## 2021-11-01 ENCOUNTER — Encounter (INDEPENDENT_AMBULATORY_CARE_PROVIDER_SITE_OTHER): Payer: Self-pay | Admitting: Vascular Surgery

## 2021-11-03 ENCOUNTER — Encounter (INDEPENDENT_AMBULATORY_CARE_PROVIDER_SITE_OTHER): Payer: Self-pay | Admitting: Vascular Surgery

## 2021-11-04 ENCOUNTER — Telehealth (INDEPENDENT_AMBULATORY_CARE_PROVIDER_SITE_OTHER): Payer: Self-pay

## 2021-11-04 NOTE — Telephone Encounter (Signed)
Spoke with the patient and she is scheduled with Dr. Delana Meyer on 11/15/21 for a right leg angio with intervention with a 11:30 am arrival time to the MM. Pre-procedure instructions were discussed and will be mailed.

## 2021-11-15 ENCOUNTER — Ambulatory Visit
Admission: RE | Admit: 2021-11-15 | Discharge: 2021-11-15 | Disposition: A | Discharge status: Home / Self Care | Payer: Medicare Other | Source: Ambulatory Visit | Attending: Vascular Surgery | Admitting: Vascular Surgery

## 2021-11-15 ENCOUNTER — Other Ambulatory Visit: Payer: Self-pay

## 2021-11-15 ENCOUNTER — Encounter
Admission: RE | Disposition: A | Discharge status: Home / Self Care | Payer: Self-pay | Source: Ambulatory Visit | Attending: Vascular Surgery

## 2021-11-15 ENCOUNTER — Encounter: Payer: Self-pay | Admitting: Vascular Surgery

## 2021-11-15 DIAGNOSIS — I10 Essential (primary) hypertension: Secondary | ICD-10-CM | POA: Diagnosis not present

## 2021-11-15 DIAGNOSIS — I70219 Atherosclerosis of native arteries of extremities with intermittent claudication, unspecified extremity: Secondary | ICD-10-CM

## 2021-11-15 DIAGNOSIS — M48061 Spinal stenosis, lumbar region without neurogenic claudication: Secondary | ICD-10-CM | POA: Diagnosis not present

## 2021-11-15 DIAGNOSIS — I70211 Atherosclerosis of native arteries of extremities with intermittent claudication, right leg: Secondary | ICD-10-CM

## 2021-11-15 DIAGNOSIS — I70213 Atherosclerosis of native arteries of extremities with intermittent claudication, bilateral legs: Secondary | ICD-10-CM | POA: Insufficient documentation

## 2021-11-15 DIAGNOSIS — E782 Mixed hyperlipidemia: Secondary | ICD-10-CM | POA: Insufficient documentation

## 2021-11-15 HISTORY — PX: LOWER EXTREMITY ANGIOGRAPHY: CATH118251

## 2021-11-15 LAB — CREATININE, SERUM
Creatinine, Ser: 0.52 mg/dL (ref 0.44–1.00)
GFR, Estimated: >60 mL/min (ref >60)

## 2021-11-15 LAB — BUN: BUN: 18 mg/dL (ref 8–23)

## 2021-11-15 SURGERY — LOWER EXTREMITY ANGIOGRAPHY
Anesthesia: Moderate Sedation | Site: Leg Lower | Laterality: Right

## 2021-11-15 MED ORDER — CEFAZOLIN SODIUM-DEXTROSE 2-4 GM/100ML-% IV SOLN: 2.0000 g | INTRAVENOUS | Status: AC

## 2021-11-15 MED ORDER — FENTANYL CITRATE (PF) 100 MCG/2ML IJ SOLN
INTRAMUSCULAR | Status: DC | PRN
  Administered 2021-11-15: 50 ug via INTRAVENOUS
  Administered 2021-11-15: 25 ug via INTRAVENOUS

## 2021-11-15 MED ORDER — HYDRALAZINE HCL 20 MG/ML IJ SOLN: 5.0000 mg | INTRAMUSCULAR | Status: DC | PRN

## 2021-11-15 MED ORDER — MIDAZOLAM HCL 2 MG/2ML IJ SOLN
INTRAMUSCULAR | Status: AC
  Filled 2021-11-15: qty 2

## 2021-11-15 MED ORDER — HYDROMORPHONE HCL 1 MG/ML IJ SOLN: 1.0000 mg | Freq: Once | INTRAMUSCULAR | Status: DC | PRN

## 2021-11-15 MED ORDER — LABETALOL HCL 5 MG/ML IV SOLN: 10.0000 mg | INTRAVENOUS | Status: DC | PRN

## 2021-11-15 MED ORDER — SODIUM CHLORIDE 0.9 % IV SOLN: INTRAVENOUS | Status: DC

## 2021-11-15 MED ORDER — ONDANSETRON HCL 4 MG/2ML IJ SOLN: 4.0000 mg | Freq: Four times a day (QID) | INTRAMUSCULAR | Status: DC | PRN

## 2021-11-15 MED ORDER — MIDAZOLAM HCL 2 MG/ML PO SYRP: 8.0000 mg | ORAL_SOLUTION | Freq: Once | ORAL | Status: DC | PRN

## 2021-11-15 MED ORDER — FENTANYL CITRATE (PF) 100 MCG/2ML IJ SOLN
INTRAMUSCULAR | Status: AC
  Filled 2021-11-15: qty 2

## 2021-11-15 MED ORDER — SODIUM CHLORIDE 0.9 % IV SOLN: 250.0000 mL | INTRAVENOUS | Status: DC | PRN

## 2021-11-15 MED ORDER — CLOPIDOGREL BISULFATE 300 MG PO TABS
300.0000 mg | ORAL_TABLET | ORAL | Status: AC
  Administered 2021-11-15: 300 mg via ORAL

## 2021-11-15 MED ORDER — OXYCODONE HCL 5 MG PO TABS: 5.0000 mg | ORAL_TABLET | ORAL | Status: DC | PRN

## 2021-11-15 MED ORDER — DIPHENHYDRAMINE HCL 50 MG/ML IJ SOLN: 50.0000 mg | Freq: Once | INTRAMUSCULAR | Status: DC | PRN

## 2021-11-15 MED ORDER — ACETAMINOPHEN 325 MG PO TABS: 650.0000 mg | ORAL_TABLET | ORAL | Status: DC | PRN

## 2021-11-15 MED ORDER — METHYLPREDNISOLONE SODIUM SUCC 125 MG IJ SOLR: 125.0000 mg | Freq: Once | INTRAMUSCULAR | Status: DC | PRN

## 2021-11-15 MED ORDER — FAMOTIDINE 20 MG PO TABS: 40.0000 mg | ORAL_TABLET | Freq: Once | ORAL | Status: DC | PRN

## 2021-11-15 MED ORDER — HEPARIN SODIUM (PORCINE) 1000 UNIT/ML IJ SOLN
INTRAMUSCULAR | Status: DC | PRN
  Administered 2021-11-15: 4000 [IU] via INTRAVENOUS

## 2021-11-15 MED ORDER — SODIUM CHLORIDE 0.9% FLUSH: 3.0000 mL | Freq: Two times a day (BID) | INTRAVENOUS | Status: DC

## 2021-11-15 MED ORDER — SODIUM CHLORIDE 0.9% FLUSH: 3.0000 mL | INTRAVENOUS | Status: DC | PRN

## 2021-11-15 MED ORDER — CLOPIDOGREL BISULFATE 75 MG PO TABS
ORAL_TABLET | ORAL | Status: AC
  Filled 2021-11-15: qty 4

## 2021-11-15 MED ORDER — CEFAZOLIN SODIUM-DEXTROSE 2-4 GM/100ML-% IV SOLN
INTRAVENOUS | Status: AC
  Administered 2021-11-15: 2 g via INTRAVENOUS
  Filled 2021-11-15: qty 100

## 2021-11-15 MED ORDER — HEPARIN SODIUM (PORCINE) 1000 UNIT/ML IJ SOLN
INTRAMUSCULAR | Status: AC
  Filled 2021-11-15: qty 10

## 2021-11-15 MED ORDER — CLOPIDOGREL BISULFATE 75 MG PO TABS: 75.0000 mg | ORAL_TABLET | Freq: Every day | ORAL | 5 refills | Status: DC

## 2021-11-15 MED ORDER — MORPHINE SULFATE (PF) 4 MG/ML IV SOLN: 2.0000 mg | INTRAVENOUS | Status: DC | PRN

## 2021-11-15 MED ORDER — MIDAZOLAM HCL 2 MG/2ML IJ SOLN
INTRAMUSCULAR | Status: DC | PRN
  Administered 2021-11-15: 2 mg via INTRAVENOUS
  Administered 2021-11-15: 1 mg via INTRAVENOUS

## 2021-11-15 SURGICAL SUPPLY — 19 items
BALLN LUTONIX DCB 4X40X130 (BALLOONS) ×1
BALLN LUTONIX DCB 5X60X130 (BALLOONS) ×1
BALLOON LUTONIX DCB 4X40X130 (BALLOONS) IMPLANT
BALLOON LUTONIX DCB 5X60X130 (BALLOONS) IMPLANT
CATH ANGIO 5F PIGTAIL 65CM (CATHETERS) IMPLANT
DEVICE STARCLOSE SE CLOSURE (Vascular Products) IMPLANT
GLIDEWIRE ADV .035X260CM (WIRE) IMPLANT
KIT ENCORE 26 ADVANTAGE (KITS) IMPLANT
NDL ENTRY 21GA 7CM ECHOTIP (NEEDLE) IMPLANT
NEEDLE ENTRY 21GA 7CM ECHOTIP (NEEDLE) ×1 IMPLANT
PACK ANGIOGRAPHY (CUSTOM PROCEDURE TRAY) ×1 IMPLANT
SET INTRO CAPELLA COAXIAL (SET/KITS/TRAYS/PACK) IMPLANT
SHEATH ANL2 6FRX45 HC (SHEATH) IMPLANT
SHEATH BRITE TIP 5FRX11 (SHEATH) IMPLANT
SHEATH PROBE COVER 6X72 (BAG) IMPLANT
STENT LIFESTENT 5F 6X60X135 (Permanent Stent) IMPLANT
SYR MEDRAD MARK 7 150ML (SYRINGE) IMPLANT
TUBING CONTRAST HIGH PRESS 72 (TUBING) IMPLANT
WIRE GUIDERIGHT .035X150 (WIRE) IMPLANT

## 2021-11-15 NOTE — Op Note (Signed)
[facility]  Percutaneous Study/Intervention Procedural Note   Date of Surgery: 11/15/2021  Surgeon:  Katha Cabal, MD.  Pre-operative Diagnosis: Atherosclerotic occlusive disease bilateral lower extremities with lifestyle limiting claudication symptoms of the right lower extremity  Post-operative diagnosis:  Same  Procedure(s) Performed:             1.  Introduction catheter into right lower extremity 3rd order catheter placement              2.    Contrast injection right lower extremity for distal runoff             3.  Percutaneous transluminal angioplasty and stent placement right superficial femoral artery.             4.  Star close closure left common femoral arteriotomy  Anesthesia: Conscious sedation was administered under my direct supervision by the interventional radiology RN. IV Versed plus fentanyl were utilized. Continuous ECG, pulse oximetry and blood pressure was monitored throughout the entire procedure.  Conscious sedation was for a total of 38 minutes and 55 seconds minutes.  Sheath: 6 [place] Rabie left common femoral retrograde  Contrast: 45 cc  Fluoroscopy Time: 5.2 minutes  Indications:  Elizabeth Li presents with increasing pain of her right lower extremity.  She expresses lifestyle limiting situation.  CT angio was done which does demonstrate a focal lesion within the right SFA.  However the imaging of the aorta and iliac arteries appears inadequate and confusing.  After lengthy discussion the patient and I have decided to move forward with angiography with the hope for intervention and elimination of her symptoms.  The risks and benefits are reviewed all questions answered patient agrees to proceed.  Procedure:  Elizabeth Li is a 66 y.o. y.o. female who was identified and appropriate procedural time out was performed.  The patient was then placed supine on the table and prepped and draped in the usual sterile fashion.     Ultrasound was placed in the sterile sleeve and the left groin was evaluated the left common femoral artery was echolucent and pulsatile indicating patency.  Image was recorded for the permanent record and under real-time visualization a microneedle was inserted into the common femoral artery microwire followed by a micro-sheath.  A J-wire was then advanced through the micro-sheath and a  5 [place] sheath was then inserted over a J-wire. J-wire was then advanced and a 5 French pigtail catheter was positioned at the level of T12. AP projection of the aorta was then obtained. Pigtail catheter was repositioned to above the bifurcation and a LAO view of the pelvis was obtained.  Subsequently a pigtail catheter with the stiff angle Glidewire was used to cross the aortic bifurcation the catheter wire were advanced down into the right distal external iliac artery. Oblique view of the femoral bifurcation was then obtained and subsequently the wire was reintroduced and the pigtail catheter negotiated into the SFA representing third order catheter placement. Distal runoff was then performed.  Diagnostic interpretation: The abdominal aorta is opacified with a bolus injection contrast.  Single renal arteries are noted.  There is no evidence of renal artery stenosis or other hemodynamically significant lesions.  The aortic bifurcation is widely patent bilateral common internal and external iliac arteries are widely patent without evidence of hemodynamically significant stenosis.  No evidence of previously placed stents or other prior vascular intervention.  The right common femoral profunda femoris are widely patent without hemodynamically  significant stenosis.  In the SFA approximately 10 cm distal to the origin there is a focal 20 mm length greater than 90% stenosis.  Approximately 2 cm below this there is a focal 60% stenosis that extends over 5 to 10 mm.  Beyond this lesion there is mild atherosclerotic changes but  there are no hemodynamically significant lesions of the distal SFA or the popliteal artery.  The trifurcation is widely patent.  There is three-vessel runoff to the foot.  There is identified a 60-70% stenosis in the anterior tibial approximately 1 cm after the origin and there is poor filling of the dorsalis pedis although the distal anterior tibial and the dorsalis pedis do appear to be patent.  The posterior tibial fills the plantar arteries which fill the pedal arch quite nicely.  5000 units of heparin was then given and allowed to circulate and a 6 [place] Rabie sheath was advanced up and over the bifurcation and positioned in the femoral artery  KMP  catheter and stiff angle Glidewire were then negotiated down into the distal popliteal.  Distal runoff was then completed by hand injection through the catheter. The wire was then reintroduced and a 4 mm x 40 mm Lutonix drug-eluting balloon was used to angioplasty the superficial femoral artery. Inflation were to 10 atmospheres for 2 minutes. Follow-up imaging demonstrated greater than 60% residual stenosis, therefore a 6 mm x 60 mm life stent was deployed and subsequently postdilated with a 5 mm by 60 mm Lutonix drug-eluting balloon inflated to 8 atm for approximately 1 minute. Distal runoff was then reassessed and found to be unchanged.  After review of these images the sheath is pulled into the left external iliac oblique of the common femoral is obtained and a Star close device deployed. There no immediate complications.   Findings:   The abdominal aorta is opacified with a bolus injection contrast.  Single renal arteries are noted.  There is no evidence of renal artery stenosis or other hemodynamically significant lesions.  The aortic bifurcation is widely patent bilateral common internal and external iliac arteries are widely patent without evidence of hemodynamically significant stenosis.  No evidence of previously placed stents or other prior  vascular intervention.  The right common femoral profunda femoris are widely patent without hemodynamically significant stenosis.  In the right SFA approximately 10 cm distal to the origin there is a focal 20 mm length greater than 90% stenosis.  Approximately 2 cm below this there is a focal 60% stenosis that extends over 5 to 10 mm.  Beyond this lesion there is mild atherosclerotic changes but there are no hemodynamically significant lesions of the distal right SFA or the popliteal artery.  The trifurcation is widely patent.  There is three-vessel runoff to the foot.  There is identified a 60-70% stenosis in the anterior tibial approximately 1 cm after the origin and there is poor filling of the dorsalis pedis although the distal anterior tibial and the dorsalis pedis do appear to be patent.  The posterior tibial fills the plantar arteries which fill the pedal arch quite nicely.  Following angioplasty there is greater than 50% residual stenosis and I elected to stent this area.  Life stent is deployed across the right SFA lesion and postdilated with a 5 mm balloon.  Follow-up imaging demonstrates less than 10% residual stenosis with preservation of distal runoff.    Summary: Successful recanalization right lower extremity for limb salvage  Disposition: Patient was taken to the recovery room in stable condition having tolerated the procedure well.  Elizabeth Li, Dolores Lory 11/15/2021,3:16 PM

## 2021-11-15 NOTE — Interval H&P Note (Signed)
History and Physical Interval Note:  11/15/2021 12:42 PM  Elizabeth Li  has presented today for surgery, with the diagnosis of RLE Angio w intervention   BARD   ASO w claudication.  The various methods of treatment have been discussed with the patient and family. After consideration of risks, benefits and other options for treatment, the patient has consented to  Procedure(s): Lower Extremity Angiography (Right) as a surgical intervention.  The patient's history has been reviewed, patient examined, no change in status, stable for surgery.  I have reviewed the patient's chart and labs.  Questions were answered to the patient's satisfaction.     [name]

## 2021-11-19 ENCOUNTER — Encounter: Payer: Self-pay | Admitting: Vascular Surgery

## 2021-11-27 ENCOUNTER — Other Ambulatory Visit (INDEPENDENT_AMBULATORY_CARE_PROVIDER_SITE_OTHER): Payer: Self-pay | Admitting: Vascular Surgery

## 2021-11-27 DIAGNOSIS — Z95820 Peripheral vascular angioplasty status with implants and grafts: Secondary | ICD-10-CM

## 2021-11-27 DIAGNOSIS — I70211 Atherosclerosis of native arteries of extremities with intermittent claudication, right leg: Secondary | ICD-10-CM

## 2021-12-02 ENCOUNTER — Ambulatory Visit (INDEPENDENT_AMBULATORY_CARE_PROVIDER_SITE_OTHER): Payer: Medicare Other

## 2021-12-02 ENCOUNTER — Ambulatory Visit (INDEPENDENT_AMBULATORY_CARE_PROVIDER_SITE_OTHER): Payer: Medicare Other | Admitting: Vascular Surgery

## 2021-12-02 ENCOUNTER — Encounter (INDEPENDENT_AMBULATORY_CARE_PROVIDER_SITE_OTHER): Payer: Self-pay | Admitting: Vascular Surgery

## 2021-12-02 VITALS — BP 159/65 | HR 66 | Resp 16 | Wt 137.0 lb

## 2021-12-02 DIAGNOSIS — I10 Essential (primary) hypertension: Secondary | ICD-10-CM | POA: Diagnosis not present

## 2021-12-02 DIAGNOSIS — E782 Mixed hyperlipidemia: Secondary | ICD-10-CM | POA: Diagnosis not present

## 2021-12-02 DIAGNOSIS — K219 Gastro-esophageal reflux disease without esophagitis: Secondary | ICD-10-CM

## 2021-12-02 DIAGNOSIS — Z95820 Peripheral vascular angioplasty status with implants and grafts: Secondary | ICD-10-CM

## 2021-12-02 DIAGNOSIS — I70211 Atherosclerosis of native arteries of extremities with intermittent claudication, right leg: Secondary | ICD-10-CM

## 2021-12-03 ENCOUNTER — Ambulatory Visit
Admission: RE | Admit: 2021-12-03 | Discharge: 2021-12-03 | Disposition: A | Discharge status: Home / Self Care | Payer: Medicare Other | Source: Ambulatory Visit | Attending: Gastroenterology | Admitting: Gastroenterology

## 2021-12-03 ENCOUNTER — Other Ambulatory Visit: Payer: Self-pay | Admitting: Gastroenterology

## 2021-12-03 ENCOUNTER — Ambulatory Visit (INDEPENDENT_AMBULATORY_CARE_PROVIDER_SITE_OTHER): Payer: Medicare Other | Admitting: Podiatry

## 2021-12-03 DIAGNOSIS — M722 Plantar fascial fibromatosis: Secondary | ICD-10-CM

## 2021-12-03 DIAGNOSIS — R1084 Generalized abdominal pain: Secondary | ICD-10-CM | POA: Diagnosis present

## 2021-12-03 DIAGNOSIS — I70211 Atherosclerosis of native arteries of extremities with intermittent claudication, right leg: Secondary | ICD-10-CM | POA: Diagnosis not present

## 2021-12-03 DIAGNOSIS — M778 Other enthesopathies, not elsewhere classified: Secondary | ICD-10-CM

## 2021-12-03 MED ORDER — TRIAMCINOLONE ACETONIDE 40 MG/ML IJ SUSP
40.0000 mg | Freq: Once | INTRAMUSCULAR | Status: AC
  Administered 2021-12-03: 40 mg

## 2021-12-03 MED ORDER — DEXAMETHASONE SODIUM PHOSPHATE 120 MG/30ML IJ SOLN
4.0000 mg | Freq: Once | INTRAMUSCULAR | Status: AC
  Administered 2021-12-03: 4 mg via INTRA_ARTICULAR

## 2021-12-03 MED ORDER — IOHEXOL 300 MG/ML  SOLN
100.0000 mL | Freq: Once | INTRAMUSCULAR | Status: AC | PRN
Start: 2021-12-03 — End: 2021-12-03
  Administered 2021-12-03: 100 mL via INTRAVENOUS

## 2021-12-03 NOTE — Progress Notes (Signed)
She presents today for follow-up of her bilateral feet.  States that her plantar fibromas are really painful.  She is also having pain at the level of the second metatarsophalangeal joints bilaterally.  Has recently undergone revascularization of the right lower extremity is currently taking Plavix.  She will follow-up with vascular surgery for a scheduled release date of 3 months.  Objective: Vital signs stable oriented x3 plantar fibromas are noted bilateral pulses are palpable bilateral.  She has pain on palpation of the second metatarsophalangeal joint of the right foot.  Left foot similarly but to a lesser degree.  MRI findings are consistent with plantar fibroma right and osteoarthritic changes but no significant tear or capsulitis of the second metatarsophalangeal joint.  Assessment: Capsulitis second metatarsophalangeal joint bilateral peripheral vascular disease.  And plantar fibromatosis bilateral.  Plan: Discussed etiology pathology conservative surgical therapies injected 10 mg of Kenalog bilateral plantar fibromas directly into the fibromas themselves.  She tolerated this well.  Also injected dexamethasone and local anesthetic around the second metatarsophalangeal joints bilaterally.  Follow-up with her in about 3 months.

## 2021-12-06 ENCOUNTER — Telehealth (INDEPENDENT_AMBULATORY_CARE_PROVIDER_SITE_OTHER): Payer: Self-pay

## 2021-12-06 NOTE — Telephone Encounter (Signed)
Spoke with pt and she states understanding.  She states will leave the meds alone at this time.  I stressed to her that she needs to make an appt with her PCP. She states understanding.

## 2021-12-06 NOTE — Telephone Encounter (Signed)
It is very unlikely.  Plavix shouldn't affect your ability to speak and shouldn't cause abdominal pain.  I can't rule our a rash but sometimes people have delayed sensitivity to contrast dye so that could be the cause of her rash.  Plavix only has one dosage, so it isn't the dosage.  Because the patient recently had a stent placed less than three months ago, she needs to be on some sort of dual antiplatelet therapy to prevent it's closure.  So that leaves her with several options:   1: She can keep taking the plavix/aspirin and continue to work with her PCP to evaluate causes of these issues. 2: She can take a full dose 325 mg asa and stop the plavix, but this places her at increased risk for possible stent thrombosis wince we haven't had dual antiplatelet therapy for 3 months post stent placement.   3. We can place you on eliquis and stop plavix, which can be a bit more costly than plavix, but decreases the likelihood of stent thrombosis

## 2021-12-06 NOTE — Telephone Encounter (Signed)
Pt LVM stating that she has been on Plavix for 3 weeksand she thinks she is having a reaction to it.  She feels as if she can't talk, she is broke out with a rash and having abdominal pain in which she saw GI for and her CT was normal.  Can these problems be coming from the Plavix being too strong? Please advise.

## 2021-12-08 ENCOUNTER — Encounter (INDEPENDENT_AMBULATORY_CARE_PROVIDER_SITE_OTHER): Payer: Self-pay | Admitting: Vascular Surgery

## 2021-12-08 DIAGNOSIS — K219 Gastro-esophageal reflux disease without esophagitis: Secondary | ICD-10-CM | POA: Insufficient documentation

## 2021-12-08 DIAGNOSIS — I70219 Atherosclerosis of native arteries of extremities with intermittent claudication, unspecified extremity: Secondary | ICD-10-CM | POA: Insufficient documentation

## 2021-12-08 NOTE — Progress Notes (Signed)
MRN : [id number]  Elizabeth Li is a 66 y.o. (05-Dec-1955) female who presents with chief complaint of check circulation.  History of Present Illness:   The patient returns to the office for followup and review status post angiogram with intervention on 11/15/2021.   Procedure:  Percutaneous transluminal angioplasty and stent placement right superficial femoral artery  The patient notes a big improvement in the lower extremity symptoms. No interval shortening of the patient's claudication distance or rest pain symptoms. No new ulcers or wounds have occurred since the last visit.  There have been no significant changes to the patient's overall health care.  No documented history of amaurosis fugax or recent TIA symptoms. There are no recent neurological changes noted. No documented history of DVT, PE or superficial thrombophlebitis. The patient denies recent episodes of angina or shortness of breath.   ABI's Rt=1.18 and Lt=1.04  (previous ABI's Rt=0.95 and Lt=0.98)   Current Meds  Medication Sig   aspirin EC 81 MG tablet Take 81 mg by mouth daily.   BIOTIN PO Take by mouth.   chlorthalidone (HYGROTON) 25 MG tablet Take 25 mg by mouth daily.   clopidogrel (PLAVIX) 75 MG tablet Take 1 tablet (75 mg total) by mouth daily.   FLUoxetine (PROZAC) 20 MG capsule Take 20 mg by mouth daily.   Glucosamine-Chondroitin (GLUCOSAMINE CHONDR COMPLEX PO) Take 2 tablets by mouth daily.   losartan (COZAAR) 100 MG tablet Take 100 mg by mouth daily.   Multiple Vitamin (MULTIVITAMIN WITH MINERALS) TABS tablet Take 1 tablet by mouth daily.   omeprazole (PRILOSEC) 20 MG capsule Take 20 mg by mouth daily.   simvastatin (ZOCOR) 40 MG tablet Take 40 mg by mouth daily.   VITAMIN D, CHOLECALCIFEROL, PO Take 1 tablet by mouth daily.    Past Medical History:  Diagnosis Date   Arthritis    Benign essential HTN 11/20/2016   Chest pain 11/20/2016   Diverticulitis    History of frequent  urinary tract infections    Hypercholesterolemia    Hyperlipidemia 11/20/2016   Hypertension    Wears glasses     Past Surgical History:  Procedure Laterality Date   COLONOSCOPY  07/15/2008   polyps-done in [place] N/A 11/24/2014   Dr. Gala Romney: colonic diverticulosis, melanosis coli, surveillance in 2021   [place] N/A 10/08/2015   Procedure: LAPAROSCOPIC PARTIAL COLECTOMY;  Surgeon: Excell Seltzer, MD;  Location: [facility];  Service: General;  Laterality: N/A;   LOWER EXTREMITY ANGIOGRAPHY Right 11/15/2021   Procedure: Lower Extremity Angiography;  Surgeon: Katha Cabal, MD;  Location: [facility];  Service: Cardiovascular;  Laterality: Right;   TONSILLECTOMY     TRIGGER FINGER RELEASE Right 02/27/2015   Procedure: RELEASE TRIGGER FINGER/A-1 PULLEY RIGHT LONG FINGER;  Surgeon: Leanora Cover, MD;  Location: [facility];  Service: Orthopedics;  Laterality: Right;    Social History Social History   Tobacco Use   Smoking status: Never   Smokeless tobacco: Never  Substance Use Topics   Alcohol use: Yes    Alcohol/week: 3.0 standard drinks of alcohol    Types: 3 Glasses of wine per week    Comment: 3-4 per week   Drug use: No    Family History Family History  Problem Relation Age of Onset   Arthritis Mother    Heart disease Mother  Arthritis Father    Heart disease Father    CVA Father    CAD Father        multi vessel   Heart disease Brother    Colon cancer Neg Hx     Allergies  Allergen Reactions   Flagyl [Metronidazole] Other (See Comments)    Causes weakness and dizziness      REVIEW OF SYSTEMS (Negative unless checked)  Constitutional: '[]'$ Weight loss  '[]'$ Fever  '[]'$ Chills Cardiac: '[]'$ Chest pain   '[]'$ Chest pressure   '[]'$ Palpitations   '[]'$ Shortness of breath when laying flat   '[]'$ Shortness of breath with exertion. Vascular:  '[x]'$ Pain in legs with walking   '[]'$ Pain in legs at rest  '[]'$ History of  DVT   '[]'$ Phlebitis   '[]'$ Swelling in legs   '[]'$ Varicose veins   '[]'$ Non-healing ulcers Pulmonary:   '[]'$ Uses home oxygen   '[]'$ Productive cough   '[]'$ Hemoptysis   '[]'$ Wheeze  '[]'$ COPD   '[]'$ Asthma Neurologic:  '[]'$ Dizziness   '[]'$ Seizures   '[]'$ History of stroke   '[]'$ History of TIA  '[]'$ Aphasia   '[]'$ Vissual changes   '[]'$ Weakness or numbness in arm   '[]'$ Weakness or numbness in leg Musculoskeletal:   '[]'$ Joint swelling   '[]'$ Joint pain   '[]'$ Low back pain Hematologic:  '[]'$ Easy bruising  '[]'$ Easy bleeding   '[]'$ Hypercoagulable state   '[]'$ Anemic Gastrointestinal:  '[]'$ Diarrhea   '[]'$ Vomiting  '[x]'$ Gastroesophageal reflux/heartburn   '[]'$ Difficulty swallowing. Genitourinary:  '[]'$ Chronic kidney disease   '[]'$ Difficult urination  '[]'$ Frequent urination   '[]'$ Blood in urine Skin:  '[]'$ Rashes   '[]'$ Ulcers  Psychological:  '[]'$ History of anxiety   '[]'$  History of major depression.  Physical Examination  Vitals:   12/02/21 1555  BP: (!) 159/65  Pulse: 66  Resp: 16  Weight: 137 lb (62.1 kg)   Body mass index is 25.06 kg/m. Gen: WD/WN, NAD Head: [place]/AT, No temporalis wasting.  Ear/Nose/Throat: Hearing grossly intact, nares w/o erythema or drainage Eyes: PER, EOMI, sclera nonicteric.  Neck: Supple, no masses.  No bruit or JVD.  Pulmonary:  Good air movement, no audible wheezing, no use of accessory muscles.  Cardiac: RRR, normal S1, S2, no Murmurs. Vascular:  mild trophic changes, no open wounds Vessel Right Left  Radial Palpable Palpable  PT Palpable Palpable  DP Palpable Palpable  Gastrointestinal: soft, non-distended. No guarding/no peritoneal signs.  Musculoskeletal: M/S 5/5 throughout.  No visible deformity.  Neurologic: CN 2-12 intact. Pain and light touch intact in extremities.  Symmetrical.  Speech is fluent. Motor exam as listed above. Psychiatric: Judgment intact, Mood & affect appropriate for pt's clinical situation. Dermatologic: No rashes or ulcers noted.  No changes consistent with cellulitis.   CBC Lab Results  Component Value Date   WBC  6.6 10/16/2018   HGB 14.1 10/16/2018   HCT 40.5 10/16/2018   MCV 88.6 10/16/2018   PLT 290 10/16/2018    BMET    Component Value Date/Time   NA 136 10/17/2018 0821   K 4.0 10/17/2018 0821   CL 103 10/17/2018 0821   CO2 24 10/17/2018 0821   GLUCOSE 113 (H) 10/17/2018 0821   BUN 18 11/15/2021 1207   CREATININE 0.52 11/15/2021 1207   CALCIUM 9.2 10/17/2018 0821   GFRNONAA >60 11/15/2021 1207   GFRAA >60 10/17/2018 0821   CrCl cannot be calculated (Patient's most recent lab result is older than the maximum 21 days allowed.).  COAG No results found for: "INR", "PROTIME"  Radiology CT ABDOMEN PELVIS W CONTRAST  Result Date: 12/03/2021 CLINICAL DATA:  Mid to lower abdominal pain  for 2 days.  Diarrhea. EXAM: CT ABDOMEN AND PELVIS WITH CONTRAST TECHNIQUE: Multidetector CT imaging of the abdomen and pelvis was performed using the standard protocol following bolus administration of intravenous contrast. RADIATION DOSE REDUCTION: This exam was performed according to the departmental dose-optimization program which includes automated exposure control, adjustment of the mA and/or kV according to patient size and/or use of iterative reconstruction technique. CONTRAST:  112m OMNIPAQUE IOHEXOL 300 MG/ML  SOLN COMPARISON:  None Available. FINDINGS: Lower chest: No acute abnormality. Hepatobiliary: No focal liver abnormality is seen. No gallstones, gallbladder wall thickening, or biliary dilatation. Pancreas: Unremarkable. No pancreatic ductal dilatation or surrounding inflammatory changes. Spleen: Normal in size without focal abnormality. Adrenals/Urinary Tract: Adrenal glands are unremarkable. Kidneys are normal, without renal calculi, focal lesion, or hydronephrosis. Bladder is unremarkable. Stomach/Bowel: Stomach is within normal limits. Appendix appears normal. No evidence of bowel wall thickening, distention, or inflammatory changes. Colonic diverticulosis without evidence of acute diverticulitis.  Postsurgical changes for prior partial colectomy. Vascular/Lymphatic: Aortic atherosclerosis. No enlarged abdominal or pelvic lymph nodes. Reproductive: Uterus and bilateral adnexa are unremarkable. Other: No abdominal wall hernia or abnormality. No abdominopelvic ascites. Musculoskeletal: Degenerate disc disease of the lumbar spine prominent at L4-L5 and L5-S1 with associated facet joint arthropathy. IMPRESSION: 1. Bowel loops are normal in caliber. No significant bowel wall thickening to suggest colitis. 2. Colonic diverticulosis prominent in the sigmoid colon without evidence of acute diverticulitis. Postsurgical changes for prior partial colectomy. 3. Mild aortic atherosclerotic calcifications. 4. Degenerative disc disease of the lumbar spine prominent at L4-L5 and L5-S1 with associated facet joint arthropathy. No acute osseous abnormality. Electronically Signed   By: IKeane PoliceD.O.   On: 12/03/2021 11:56   PERIPHERAL VASCULAR CATHETERIZATION  Result Date: 11/15/2021 See surgical note for result.    Assessment/Plan 1. Atherosclerosis of native artery of right lower extremity with intermittent claudication (HCC) Recommend:  The patient is status post successful angiogram with intervention.  The patient reports that the claudication symptoms and leg pain has improved.   The patient denies lifestyle limiting changes at this point in time.  No further invasive studies, angiography or surgery at this time The patient should continue walking and begin a more formal exercise program.  The patient should continue antiplatelet therapy and aggressive treatment of the lipid abnormalities  Continued surveillance is indicated as atherosclerosis is likely to progress with time.    Patient should undergo noninvasive studies as ordered. The patient will follow up with me to review the studies.   - VAS [place] WITH/WO TBI - VAS [place]OWER EXTREMITY ARTERIAL DUPLEX; Future - VAS [place] WITH/WO TBI;  Future  2. Status post angioplasty with stent Recommend:  The patient is status post successful angiogram with intervention.  The patient reports that the claudication symptoms and leg pain has improved.   The patient denies lifestyle limiting changes at this point in time.  No further invasive studies, angiography or surgery at this time The patient should continue walking and begin a more formal exercise program.  The patient should continue antiplatelet therapy and aggressive treatment of the lipid abnormalities  Continued surveillance is indicated as atherosclerosis is likely to progress with time.    Patient should undergo noninvasive studies as ordered. The patient will follow up with me to review the studies.   - VAS [place] WITH/WO TBI  3. Benign essential HTN Continue antihypertensive medications as already ordered, these medications have been reviewed and there are no changes at this  time.   4. Mixed hyperlipidemia Continue statin as ordered and reviewed, no changes at this time   5. Gastroesophageal reflux disease, unspecified whether esophagitis present Continue PPI as already ordered, this medication has been reviewed and there are no changes at this time.  Avoidence of caffeine and alcohol  Moderate elevation of the head of the bed      [name], MD  12/08/2021 5:50 PM

## 2021-12-13 DIAGNOSIS — N62 Hypertrophy of breast: Secondary | ICD-10-CM | POA: Insufficient documentation

## 2022-01-13 ENCOUNTER — Encounter (INDEPENDENT_AMBULATORY_CARE_PROVIDER_SITE_OTHER): Payer: Self-pay

## 2022-01-15 ENCOUNTER — Encounter: Payer: Self-pay | Admitting: Podiatry

## 2022-01-15 ENCOUNTER — Ambulatory Visit (INDEPENDENT_AMBULATORY_CARE_PROVIDER_SITE_OTHER): Payer: Medicare Other

## 2022-01-15 ENCOUNTER — Ambulatory Visit (INDEPENDENT_AMBULATORY_CARE_PROVIDER_SITE_OTHER): Payer: Medicare Other | Admitting: Podiatry

## 2022-01-15 DIAGNOSIS — M778 Other enthesopathies, not elsewhere classified: Secondary | ICD-10-CM | POA: Diagnosis not present

## 2022-01-15 DIAGNOSIS — G5781 Other specified mononeuropathies of right lower limb: Secondary | ICD-10-CM | POA: Diagnosis not present

## 2022-01-15 DIAGNOSIS — I70211 Atherosclerosis of native arteries of extremities with intermittent claudication, right leg: Secondary | ICD-10-CM

## 2022-01-15 MED ORDER — DEXAMETHASONE SODIUM PHOSPHATE 120 MG/30ML IJ SOLN
2.0000 mg | Freq: Once | INTRAMUSCULAR | Status: AC
  Administered 2022-01-15: 2 mg via INTRA_ARTICULAR

## 2022-01-15 NOTE — Progress Notes (Signed)
She presents today for follow-up of her capsulitis bilateral states that the left foot is doing just great but the right foot is killing her.  She states that her fourth toe on her right foot really feels like it is going to blow off.  She states that the symptoms are similar from neuroma.  But she is painful from her second metatarsophalangeal joint laterally.  Objective: Vital signs are stable alert and oriented x3.  Pulses are palpable.  She has pain on palpation to the third interdigital space though there is no palpable Mulder's click at this point.  There is a scar tissue where the neuroma has been removed question of whether or not there may be a stump neuroma present.  She still has tenderness on palpation of the second and third metatarsal phalangeal joints.  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  Possibly compensatory lateral pain due to capsulitis of the second and third metatarsophalangeal joints of the right foot.  Left foot is perfectly well.  Plan: Discussed etiology pathology and surgical therapies placed her in a Darco shoe after I injected a small amount of Kenalog about 5 mg with local anesthetic to the third interdigital space of the right foot.  I will follow-up with her once she has been removed from her Plavix and cleared for surgery by vascular surgery.

## 2022-02-13 ENCOUNTER — Ambulatory Visit (INDEPENDENT_AMBULATORY_CARE_PROVIDER_SITE_OTHER): Payer: Medicare Other

## 2022-02-13 ENCOUNTER — Encounter (INDEPENDENT_AMBULATORY_CARE_PROVIDER_SITE_OTHER): Payer: Self-pay | Admitting: Vascular Surgery

## 2022-02-13 ENCOUNTER — Ambulatory Visit (INDEPENDENT_AMBULATORY_CARE_PROVIDER_SITE_OTHER): Payer: Medicare Other | Admitting: Vascular Surgery

## 2022-02-13 VITALS — BP 174/70 | HR 67 | Resp 16 | Ht 62.0 in | Wt 135.0 lb

## 2022-02-13 DIAGNOSIS — I70211 Atherosclerosis of native arteries of extremities with intermittent claudication, right leg: Secondary | ICD-10-CM

## 2022-02-13 DIAGNOSIS — E782 Mixed hyperlipidemia: Secondary | ICD-10-CM | POA: Diagnosis not present

## 2022-02-13 DIAGNOSIS — I10 Essential (primary) hypertension: Secondary | ICD-10-CM | POA: Diagnosis not present

## 2022-02-13 DIAGNOSIS — K219 Gastro-esophageal reflux disease without esophagitis: Secondary | ICD-10-CM

## 2022-02-13 DIAGNOSIS — M48061 Spinal stenosis, lumbar region without neurogenic claudication: Secondary | ICD-10-CM

## 2022-02-13 NOTE — Progress Notes (Signed)
MRN : [id number]  Elizabeth Li is a 66 y.o. ([birth year]) female who presents with chief complaint of check circulation.  History of Present Illness:  The patient returns to the office for followup and review status post angiogram with intervention on 11/15/2021.    Procedure:  Percutaneous transluminal angioplasty and stent placement right superficial femoral artery   The patient notes a big improvement in the lower extremity symptoms. No interval shortening of the patient's claudication distance or rest pain symptoms. No new ulcers or wounds have occurred since the last visit.   There have been no significant changes to the patient's overall health care.   No documented history of amaurosis fugax or recent TIA symptoms. There are no recent neurological changes noted. No documented history of DVT, PE or superficial thrombophlebitis. The patient denies recent episodes of angina or shortness of breath.    ABI's Rt=1.09 and Lt=1.1.08  (previous ABI's Rt=1.18 and Lt=1.04) Duplex ultrasound of the right lower extremity arterial system shows a widely patent arterial system no hemodynamically significant restenosis within the SFA stent   Current Meds  Medication Sig   aspirin EC 81 MG tablet Take 81 mg by mouth daily.   BIOTIN PO Take by mouth.   chlorthalidone (HYGROTON) 25 MG tablet Take 25 mg by mouth daily.   clopidogrel (PLAVIX) 75 MG tablet Take 1 tablet (75 mg total) by mouth daily.   FLUoxetine (PROZAC) 20 MG capsule Take 20 mg by mouth daily.   Glucosamine-Chondroitin (GLUCOSAMINE CHONDR COMPLEX PO) Take 2 tablets by mouth daily.   losartan (COZAAR) 100 MG tablet Take 100 mg by mouth daily.   Multiple Vitamin (MULTIVITAMIN WITH MINERALS) TABS tablet Take 1 tablet by mouth daily.   Omega-3 Fatty Acids (OMEGA 3 PO) Take by mouth.   omeprazole (PRILOSEC) 20 MG capsule Take 20 mg by mouth daily.   simvastatin (ZOCOR) 40 MG tablet Take 40 mg by mouth daily.    VITAMIN D, CHOLECALCIFEROL, PO Take 1 tablet by mouth daily.    Past Medical History:  Diagnosis Date   Arthritis    Benign essential HTN 11/20/2016   Chest pain 11/20/2016   Diverticulitis    History of frequent urinary tract infections    Hypercholesterolemia    Hyperlipidemia 11/20/2016   Hypertension    Wears glasses     Past Surgical History:  Procedure Laterality Date   COLONOSCOPY  07/15/2008   polyps-done in [place] N/A 11/24/2014   Dr. Gala Romney: colonic diverticulosis, melanosis coli, surveillance in 2021   [place] N/A 10/08/2015   Procedure: LAPAROSCOPIC PARTIAL COLECTOMY;  Surgeon: Excell Seltzer, MD;  Location: [facility];  Service: General;  Laterality: N/A;   LOWER EXTREMITY ANGIOGRAPHY Right 11/15/2021   Procedure: Lower Extremity Angiography;  Surgeon: Katha Cabal, MD;  Location: [facility];  Service: Cardiovascular;  Laterality: Right;   TONSILLECTOMY     TRIGGER FINGER RELEASE Right 02/27/2015   Procedure: RELEASE TRIGGER FINGER/A-1 PULLEY RIGHT LONG FINGER;  Surgeon: Leanora Cover, MD;  Location: [facility];  Service: Orthopedics;  Laterality: Right;    Social History Social History   Tobacco Use   Smoking status: Never   Smokeless tobacco: Never  Substance Use Topics   Alcohol use: Yes    Alcohol/week: 3.0 standard drinks of alcohol    Types: 3 Glasses of  wine per week    Comment: 3-4 per week   Drug use: No    Family History Family History  Problem Relation Age of Onset   Arthritis Mother    Heart disease Mother    Arthritis Father    Heart disease Father    CVA Father    CAD Father        multi vessel   Heart disease Brother    Colon cancer Neg Hx     Allergies  Allergen Reactions   Flagyl [Metronidazole] Other (See Comments)    Causes weakness and dizziness      REVIEW OF SYSTEMS (Negative unless checked)  Constitutional: '[]'$ Weight loss  '[]'$ Fever   '[]'$ Chills Cardiac: '[]'$ Chest pain   '[]'$ Chest pressure   '[]'$ Palpitations   '[]'$ Shortness of breath when laying flat   '[]'$ Shortness of breath with exertion. Vascular:  '[x]'$ Pain in legs with walking   '[]'$ Pain in legs at rest  '[]'$ History of DVT   '[]'$ Phlebitis   '[]'$ Swelling in legs   '[]'$ Varicose veins   '[]'$ Non-healing ulcers Pulmonary:   '[]'$ Uses home oxygen   '[]'$ Productive cough   '[]'$ Hemoptysis   '[]'$ Wheeze  '[]'$ COPD   '[]'$ Asthma Neurologic:  '[]'$ Dizziness   '[]'$ Seizures   '[]'$ History of stroke   '[]'$ History of TIA  '[]'$ Aphasia   '[]'$ Vissual changes   '[]'$ Weakness or numbness in arm   '[]'$ Weakness or numbness in leg Musculoskeletal:   '[]'$ Joint swelling   '[x]'$ Joint pain   '[x]'$ Low back pain Hematologic:  '[]'$ Easy bruising  '[]'$ Easy bleeding   '[]'$ Hypercoagulable state   '[]'$ Anemic Gastrointestinal:  '[]'$ Diarrhea   '[]'$ Vomiting  '[x]'$ Gastroesophageal reflux/heartburn   '[]'$ Difficulty swallowing. Genitourinary:  '[]'$ Chronic kidney disease   '[]'$ Difficult urination  '[]'$ Frequent urination   '[]'$ Blood in urine Skin:  '[]'$ Rashes   '[]'$ Ulcers  Psychological:  '[]'$ History of anxiety   '[]'$  History of major depression.  Physical Examination  Vitals:   02/13/22 1558  BP: (!) 174/70  Pulse: 67  Resp: 16  Weight: 135 lb (61.2 kg)  Height: '5\' 2"'$  (1.575 m)   Body mass index is 24.69 kg/m. Gen: WD/WN, NAD Head: [place]/AT, No temporalis wasting.  Ear/Nose/Throat: Hearing grossly intact, nares w/o erythema or drainage Eyes: PER, EOMI, sclera nonicteric.  Neck: Supple, no masses.  No bruit or JVD.  Pulmonary:  Good air movement, no audible wheezing, no use of accessory muscles.  Cardiac: RRR, normal S1, S2, no Murmurs. Vascular:  mild trophic changes, no open wounds Vessel Right Left  Radial Palpable Palpable  PT Palpable Palpable  DP Palpable Palpable  Gastrointestinal: soft, non-distended. No guarding/no peritoneal signs.  Musculoskeletal: M/S 5/5 throughout.  No visible deformity.  Neurologic: CN 2-12 intact. Pain and light touch intact in extremities.  Symmetrical.  Speech is  fluent. Motor exam as listed above. Psychiatric: Judgment intact, Mood & affect appropriate for pt's clinical situation. Dermatologic: No rashes or ulcers noted.  No changes consistent with cellulitis.   CBC Lab Results  Component Value Date   WBC 6.6 10/16/2018   HGB 14.1 10/16/2018   HCT 40.5 10/16/2018   MCV 88.6 10/16/2018   PLT 290 10/16/2018    BMET    Component Value Date/Time   NA 136 10/17/2018 0821   K 4.0 10/17/2018 0821   CL 103 10/17/2018 0821   CO2 24 10/17/2018 0821   GLUCOSE 113 (H) 10/17/2018 0821   BUN 18 11/15/2021 1207   CREATININE 0.52 11/15/2021 1207   CALCIUM 9.2 10/17/2018 0821   GFRNONAA >60 11/15/2021 1207   GFRAA >60 10/17/2018  0821   CrCl cannot be calculated (Patient's most recent lab result is older than the maximum 21 days allowed.).  COAG No results found for: "INR", "PROTIME"  Radiology DG Foot Complete Right  Result Date: 01/15/2022 Please see detailed radiograph report in office note.    Assessment/Plan 1. Atherosclerosis of native artery of right lower extremity with intermittent claudication (HCC) Recommend:   The patient is status post successful angiogram with intervention.  The patient reports that the claudication symptoms and leg pain has improved.   The patient denies lifestyle limiting changes at this point in time.   No further invasive studies, angiography or surgery at this time The patient should continue walking and begin a more formal exercise program.  The patient should continue antiplatelet therapy and aggressive treatment of the lipid abnormalities   Continued surveillance is indicated as atherosclerosis is likely to progress with time.     Patient should undergo noninvasive studies as ordered. The patient will follow up with me to review the studies.  - VAS [place] LOWER EXTREMITY ARTERIAL DUPLEX - VAS [place] ABI WITH/WO TBI; Future  2. Benign essential HTN Continue antihypertensive medications as already ordered,  these medications have been reviewed and there are no changes at this time.  3. Gastroesophageal reflux disease, unspecified whether esophagitis present Continue PPI as already ordered, this medication has been reviewed and there are no changes at this time.  Avoidence of caffeine and alcohol  Moderate elevation of the head of the bed   4. Mixed hyperlipidemia Continue statin as ordered and reviewed, no changes at this time  5. Spinal stenosis of lumbar region, unspecified whether neurogenic claudication present Continue NSAID medications as already ordered, these medications have been reviewed and there are no changes at this time.  Continued activity and therapy was stressed.    [name], MD  02/13/2022 4:25 PM

## 2022-02-26 ENCOUNTER — Encounter: Payer: Self-pay | Admitting: Podiatry

## 2022-02-26 ENCOUNTER — Ambulatory Visit (INDEPENDENT_AMBULATORY_CARE_PROVIDER_SITE_OTHER): Payer: Medicare Other | Admitting: Podiatry

## 2022-02-26 VITALS — BP 115/55 | HR 64

## 2022-02-26 DIAGNOSIS — I70211 Atherosclerosis of native arteries of extremities with intermittent claudication, right leg: Secondary | ICD-10-CM | POA: Diagnosis not present

## 2022-02-26 DIAGNOSIS — G5781 Other specified mononeuropathies of right lower limb: Secondary | ICD-10-CM | POA: Diagnosis not present

## 2022-02-26 NOTE — Progress Notes (Signed)
She presents today for follow-up of her neuroma and her painful second toe of her right foot.  Has just been cleared by vascular to have this done.  She is currently no longer taking her Plavix.  States that the foot is not getting better as a matter fact is getting worse has not tried to become more active to continue to keep the vascular status open.  Objective: Vital signs stable alert oriented x 3 hammertoe deformity second right with pain at the second metatarsal phalangeal joint.  She also has some significant pain third interdigital space of the right foot.  Consistent with a stump neuroma.  Assessment: Stump neuroma third interspace right foot.  Hammertoe deformity plantarflexed elongated second metatarsal with capsulitis second metatarsophalangeal joint right foot.  Plan: Discussed etiology pathology and surgical therapies at this point were going to schedule her for surgical intervention consisting of a second metatarsal osteotomy hammertoe repair with K wire and a reexcision nerve to the third interdigital space.  We did discuss possible side effects postop complications which may include but are not limited to postop pain bleeding swell infection recurrence need for further surgery overcorrection under correction also digit loss limb loss of life.  Follow-up with her in the near future for surgical intervention.

## 2022-02-28 ENCOUNTER — Telehealth: Payer: Self-pay | Admitting: Podiatry

## 2022-02-28 NOTE — Telephone Encounter (Signed)
DOS: 03/21/2022  Medicare Part A and B [employer] Medicare Supplement  Metatarsal Osteotomy 2nd Rt (43888) Hammertoe Repair 2nd Rt (75797) Neurectomy 3rd Interdigital Rt (28206)  Prior authorization is not required for either of patients insurances.

## 2022-03-13 ENCOUNTER — Ambulatory Visit (INDEPENDENT_AMBULATORY_CARE_PROVIDER_SITE_OTHER): Payer: Medicare Other | Admitting: Vascular Surgery

## 2022-03-13 ENCOUNTER — Encounter (INDEPENDENT_AMBULATORY_CARE_PROVIDER_SITE_OTHER): Payer: Medicare Other

## 2022-03-19 ENCOUNTER — Other Ambulatory Visit: Payer: Self-pay | Admitting: Podiatry

## 2022-03-19 MED ORDER — ONDANSETRON HCL 4 MG PO TABS: 4.0000 mg | ORAL_TABLET | Freq: Three times a day (TID) | ORAL | 0 refills | Status: DC | PRN

## 2022-03-19 MED ORDER — CEPHALEXIN 500 MG PO CAPS: 500.0000 mg | ORAL_CAPSULE | Freq: Three times a day (TID) | ORAL | 0 refills | Status: DC

## 2022-03-19 MED ORDER — OXYCODONE-ACETAMINOPHEN 10-325 MG PO TABS: 1.0000 | ORAL_TABLET | Freq: Three times a day (TID) | ORAL | 0 refills | Status: DC | PRN

## 2022-03-21 DIAGNOSIS — M21541 Acquired clubfoot, right foot: Secondary | ICD-10-CM | POA: Diagnosis not present

## 2022-03-21 DIAGNOSIS — G5761 Lesion of plantar nerve, right lower limb: Secondary | ICD-10-CM | POA: Diagnosis not present

## 2022-03-21 DIAGNOSIS — M2041 Other hammer toe(s) (acquired), right foot: Secondary | ICD-10-CM | POA: Diagnosis not present

## 2022-03-24 ENCOUNTER — Telehealth: Payer: Self-pay | Admitting: *Deleted

## 2022-03-24 ENCOUNTER — Ambulatory Visit (INDEPENDENT_AMBULATORY_CARE_PROVIDER_SITE_OTHER): Payer: Medicare Other | Admitting: Podiatry

## 2022-03-24 DIAGNOSIS — I739 Peripheral vascular disease, unspecified: Secondary | ICD-10-CM

## 2022-03-24 MED ORDER — TRAMADOL HCL 50 MG PO TABS: 50.0000 mg | ORAL_TABLET | Freq: Four times a day (QID) | ORAL | 0 refills | Status: AC | PRN

## 2022-03-24 NOTE — Telephone Encounter (Signed)
Scheduled today w/ Dr Sherryle Lis

## 2022-03-24 NOTE — Telephone Encounter (Signed)
Patient is calling because she is still have pain down thru left groin, radiating down thru the thigh ,back after icing,  elevating,pain medicine,ibuprofen is not helping and will not go to the ER as suggested by on call doctor over the weekend, was told that this should resolve after 72 hours but has not,please advise.

## 2022-03-24 NOTE — Telephone Encounter (Signed)
If someone can see her today to evaluate what maybe going on lets get her scheduled  . Thanks

## 2022-03-25 ENCOUNTER — Ambulatory Visit (HOSPITAL_COMMUNITY)
Admission: RE | Admit: 2022-03-25 | Discharge: 2022-03-25 | Disposition: A | Discharge status: Home / Self Care | Payer: Medicare Other | Source: Ambulatory Visit | Attending: Podiatry | Admitting: Podiatry

## 2022-03-25 DIAGNOSIS — I739 Peripheral vascular disease, unspecified: Secondary | ICD-10-CM | POA: Insufficient documentation

## 2022-03-25 NOTE — Progress Notes (Signed)
  Subjective:  Patient ID: Elizabeth Li, female    DOB: 11-08-1955,  MRN: [id number]  Chief Complaint  Patient presents with   Post-op Problem     pain down thru left groin, radiating down thru the thigh ,back after icing,  elevating,pain medicine,ibuprofen is not helping       67 y.o. female returns for post-op check.  Over the weekend she started developing sharp radiating burning shooting pain through her left hip and leg which is the opposite side of her surgical limb.  Pain is been difficult to control, the oxycodone has made her nauseous.  Review of Systems: Negative except as noted in the HPI. Denies N/V/F/Ch.   Objective:  There were no vitals filed for this visit. There is no height or weight on file to calculate BMI. Constitutional Well developed. Well nourished.  Vascular Foot warm and well perfused bilateral.  Palpable popliteal. Capillary refill normal to all digits.  Calf is soft and supple, no posterior calf or knee pain, negative Homans' sign  Neurologic Normal speech. Oriented to person, place, and time. Epicritic sensation to light touch grossly present bilaterally.  Dermatologic No signs of infection on the right foot, dried blood in bandage, dressing is clean dry and intact  Orthopedic: Good range of motion of bilateral ankle and knees    Assessment:   1. PAD (peripheral artery disease) ([place])    Plan:  Patient was evaluated and treated and all questions answered.  S/p foot surgery right -She continues to have significant pain.  I changed her to tramadol which may be more tolerated from a nausea standpoint.  Discontinue oxycodone.  She wonders if the nerve block is what is causing this pain which is possible but I discussed with her that I have not seen contralateral limb issues femoral-popliteal or adductor canal block on the other limb but could be possible.  She has not had any significant issues with sciatica previously, does have some low back pain but  more arthritic than anything.  She does have a history of stenting on the right SFA last year.  I recommended an arterial ultrasound stat to evaluate this to ensure that this has not had an acute compromise.  She will keep her appoint with Dr. Milinda Pointer on Wednesday for postop follow-up for x-rays and dressing change  No follow-ups on file.

## 2022-03-26 ENCOUNTER — Ambulatory Visit (INDEPENDENT_AMBULATORY_CARE_PROVIDER_SITE_OTHER): Payer: Medicare Other | Admitting: Podiatry

## 2022-03-26 ENCOUNTER — Ambulatory Visit (INDEPENDENT_AMBULATORY_CARE_PROVIDER_SITE_OTHER): Payer: Medicare Other

## 2022-03-26 ENCOUNTER — Encounter: Payer: Self-pay | Admitting: Podiatry

## 2022-03-26 VITALS — BP 168/74 | HR 63

## 2022-03-26 DIAGNOSIS — M778 Other enthesopathies, not elsewhere classified: Secondary | ICD-10-CM | POA: Diagnosis not present

## 2022-03-26 DIAGNOSIS — M25552 Pain in left hip: Secondary | ICD-10-CM

## 2022-03-26 DIAGNOSIS — G5781 Other specified mononeuropathies of right lower limb: Secondary | ICD-10-CM

## 2022-03-26 DIAGNOSIS — Z9889 Other specified postprocedural states: Secondary | ICD-10-CM

## 2022-03-26 DIAGNOSIS — M2041 Other hammer toe(s) (acquired), right foot: Secondary | ICD-10-CM

## 2022-03-26 NOTE — Progress Notes (Signed)
Patient has a scheduled appointment with Dr. Olivia Mackie PA tomorrow at 1:30pm to evaluate severe hip and upper leg pain left.  Ammie notified patient of appointment.

## 2022-03-26 NOTE — Progress Notes (Signed)
She presents today for her first postop visit scheduled with me date of surgery is 03/21/2018 for right foot hammertoe repair second toe right second metatarsal osteotomy with excision of the stump neuroma third interdigital space all on the right foot.  She states that the the day she started walking after surgery start experience left anterior groin and hip pain.  States that is radiating pain at all times nothing seems to make it better it hurts whether she is sitting or standing.  She saw Dr. Sherryle Lis for this Monday of this week and was referred for ABI since she does have a history of stents being placed bilaterally.  Currently her stents are in good condition and have very minimal stenosing.  She still has a dry sterile dressing intact once removed demonstrates no erythema edema cellulitis drainage or odor looking at the left foot does not demonstrate any edema either she has no calf pain no thigh pain on palpation.  Radiographs taken today demonstrate second metatarsal osteotomy is intact with 2 screws second toe retains a K wire with no arthrodesis site at the PIPJ.  Assessment: Well-healing surgical foot right.  Left groin pain.  History of osteoarthritis of the acetabulum.  Plan: Discussed etiology pathology conservative surgical therapies at this point we are referring her to Dr. Harden Mo office tomorrow for reevaluation of the anterior left groin pain.  I will follow-up with her in 1 week at which time we hope to be able to get some the sutures out of the right foot.

## 2022-03-27 NOTE — Telephone Encounter (Signed)
Stat referral/office notes, demo has been faxed  to [facility]. Patient has been scheduled 03/27/22 '@1'$ :30,patient is aware.

## 2022-04-01 ENCOUNTER — Encounter: Payer: Self-pay | Admitting: Podiatry

## 2022-04-02 ENCOUNTER — Ambulatory Visit (INDEPENDENT_AMBULATORY_CARE_PROVIDER_SITE_OTHER): Payer: Medicare Other | Admitting: Podiatry

## 2022-04-02 ENCOUNTER — Encounter: Payer: Self-pay | Admitting: Podiatry

## 2022-04-02 VITALS — BP 171/69 | HR 68

## 2022-04-02 DIAGNOSIS — G5781 Other specified mononeuropathies of right lower limb: Secondary | ICD-10-CM

## 2022-04-02 DIAGNOSIS — M2041 Other hammer toe(s) (acquired), right foot: Secondary | ICD-10-CM

## 2022-04-02 DIAGNOSIS — Z9889 Other specified postprocedural states: Secondary | ICD-10-CM

## 2022-04-02 NOTE — Progress Notes (Signed)
she presents today for postop visit #2 date of surgery was 03/21/2018 for right foot hammertoe repair and metatarsal osteotomy with excision of stump neuroma third interspace.  States that the foot feels fine they did injection today is sore in my leg. she states that she did get her foot wet while she was in the shower and when she dropped it off the best she could.  Vital signs are stable she is alert and oriented x 3.  Sterile dressing partially intact.  Once removed demonstrates sutures are intact margins well coapted K wires intact.  Assessment: well-healing surgical foot.  Plan: Not ready to take the stitches think they can stay in another week or so to help with the swelling going down hopefully she will be able to stay off his foot and leg little bit more with her hip feeling better I will follow-up with her in 1 week hopefully at that time we can take the stitches out.

## 2022-04-09 ENCOUNTER — Ambulatory Visit (INDEPENDENT_AMBULATORY_CARE_PROVIDER_SITE_OTHER): Payer: Medicare Other | Admitting: Podiatry

## 2022-04-09 ENCOUNTER — Encounter: Payer: Self-pay | Admitting: Podiatry

## 2022-04-09 DIAGNOSIS — Z9889 Other specified postprocedural states: Secondary | ICD-10-CM

## 2022-04-09 DIAGNOSIS — M2041 Other hammer toe(s) (acquired), right foot: Secondary | ICD-10-CM

## 2022-04-09 DIAGNOSIS — G5781 Other specified mononeuropathies of right lower limb: Secondary | ICD-10-CM

## 2022-04-09 NOTE — Progress Notes (Signed)
Presents today for a follow-up of her hammertoe surgery states that she is doing quite well no problems whatsoever states that the stump neuroma excision is doing well.  On tarted the boot advised to get into my sandal.  Objective: Vital signs stable alert oriented x 3 K wires intact sutures are intact margins well coapted remove the sutures today margins remain well coapted toes in good position.  Assessment: Well-healing surgical foot.  Plan: Remove sutures today margins remain well coapted placed her in a light compression sock and I am going to recommend that she wear her Darco shoe when she returns to work Saturday to keep the pin from bending.  She understands this and is amenable to it that she is really wanting to wear her sandal expressed to her that would be not a good idea.  Follow-up with her in 3 weeks for set of x-rays at which time we hope to be able to numb the area and pull the pin.

## 2022-04-16 ENCOUNTER — Encounter: Payer: Medicare Other | Admitting: Podiatry

## 2022-04-25 ENCOUNTER — Other Ambulatory Visit (HOSPITAL_COMMUNITY): Payer: Self-pay | Admitting: Family Medicine

## 2022-04-25 DIAGNOSIS — Z1231 Encounter for screening mammogram for malignant neoplasm of breast: Secondary | ICD-10-CM

## 2022-04-28 ENCOUNTER — Encounter: Payer: Self-pay | Admitting: Podiatry

## 2022-04-28 ENCOUNTER — Ambulatory Visit (INDEPENDENT_AMBULATORY_CARE_PROVIDER_SITE_OTHER): Payer: Medicare Other

## 2022-04-28 ENCOUNTER — Ambulatory Visit (INDEPENDENT_AMBULATORY_CARE_PROVIDER_SITE_OTHER): Payer: Medicare Other | Admitting: Podiatry

## 2022-04-28 DIAGNOSIS — M2041 Other hammer toe(s) (acquired), right foot: Secondary | ICD-10-CM

## 2022-04-28 DIAGNOSIS — Z9889 Other specified postprocedural states: Secondary | ICD-10-CM

## 2022-04-28 DIAGNOSIS — G5781 Other specified mononeuropathies of right lower limb: Secondary | ICD-10-CM

## 2022-04-28 NOTE — Progress Notes (Signed)
She presents today for a postop visit #4 date of surgery March 21, 2022.  She had an excision of a stump neuroma third interdigital space.  She states that everything is feeling good she still retained screw to the second toe and into the metatarsal phalangeal joint.  Objective: Vital signs stable tellurian at x 3 surgical sites right are closed K wire is retained to the second toe and metatarsal area right foot.  Radiographs taken today demonstrate well-healed osteotomy of the second metatarsal with double screw fixation.  Internal fixation is intact and complete does not demonstrate any fracture of the pin or screws.  Assessment: Retained pin second toe.  Well-healing surgical foot.  Plan: Localized the area today with 50-50 mixture Marcaine plain lidocaine plain around the second metatarsal neck.  She tolerated this injection well.  Allowed the local anesthetic to take effect and then remove the K wire in total without fracturing and disposed of it.  She tolerated this well and I will follow-up with her in about 1 month.  She will start wearing regular shoe gear and I will follow-up with her in any questions or concerns in the meantime.

## 2022-04-30 ENCOUNTER — Encounter: Payer: Medicare Other | Admitting: Podiatry

## 2022-05-26 DIAGNOSIS — M545 Low back pain, unspecified: Secondary | ICD-10-CM | POA: Insufficient documentation

## 2022-05-28 ENCOUNTER — Ambulatory Visit (INDEPENDENT_AMBULATORY_CARE_PROVIDER_SITE_OTHER): Payer: Medicare Other | Admitting: Podiatry

## 2022-05-28 ENCOUNTER — Ambulatory Visit (INDEPENDENT_AMBULATORY_CARE_PROVIDER_SITE_OTHER): Payer: Medicare Other

## 2022-05-28 ENCOUNTER — Encounter: Payer: Self-pay | Admitting: Podiatry

## 2022-05-28 DIAGNOSIS — M2041 Other hammer toe(s) (acquired), right foot: Secondary | ICD-10-CM

## 2022-05-28 DIAGNOSIS — G5781 Other specified mononeuropathies of right lower limb: Secondary | ICD-10-CM

## 2022-05-28 DIAGNOSIS — Z9889 Other specified postprocedural states: Secondary | ICD-10-CM

## 2022-05-28 NOTE — Progress Notes (Signed)
Elizabeth Li presents today date of surgery March 21, 2022 hammertoe repair and second metatarsal osteotomy with excision of stump neuroma.  She states that that stump neuroma is doing really well she is concerned about the toe because it sits up a little bit and she feels like that under lapping of the third toe is becoming imminent.  States that her hip pain is improving to some degree.  Objective: Vital signs stable she alert and oriented x 3.  Pulses are palpable.  There is no erythema just moderate edema to the forefoot remaining.  The area is indurated and there is considerable scar tissue present.  This is most likely resulting in dorsal contracture of the second toe at the metatarsal phalangeal joint allowing for medial under lapping of the third toe.  Assessment: Well-healing surgical foot radiographs confirm this.  Plan: Encouraged on physical therapy range of motion exercises and massage therapy.  Asked her if she would like me to send her to formal physical therapy but she declined she states that she will do it at home.  Follow-up with me on an as-needed basis.

## 2022-06-23 ENCOUNTER — Ambulatory Visit (HOSPITAL_COMMUNITY)
Admission: RE | Admit: 2022-06-23 | Discharge: 2022-06-23 | Disposition: A | Discharge status: Home / Self Care | Payer: Medicare Other | Source: Ambulatory Visit | Attending: Family Medicine | Admitting: Family Medicine

## 2022-06-23 DIAGNOSIS — Z1231 Encounter for screening mammogram for malignant neoplasm of breast: Secondary | ICD-10-CM | POA: Insufficient documentation

## 2022-06-26 ENCOUNTER — Ambulatory Visit (HOSPITAL_COMMUNITY): Payer: Medicare Other

## 2022-07-16 ENCOUNTER — Ambulatory Visit: Payer: Medicare Other | Admitting: Podiatry

## 2022-08-13 ENCOUNTER — Encounter: Payer: Self-pay | Admitting: Podiatry

## 2022-08-13 ENCOUNTER — Ambulatory Visit (INDEPENDENT_AMBULATORY_CARE_PROVIDER_SITE_OTHER): Payer: Medicare Other | Admitting: Podiatry

## 2022-08-13 DIAGNOSIS — M778 Other enthesopathies, not elsewhere classified: Secondary | ICD-10-CM

## 2022-08-13 MED ORDER — TRIAMCINOLONE ACETONIDE 40 MG/ML IJ SUSP
20.0000 mg | Freq: Once | INTRAMUSCULAR | Status: AC
Start: 2022-08-13 — End: 2022-08-13
  Administered 2022-08-13: 20 mg

## 2022-08-13 NOTE — Progress Notes (Signed)
She presents today for follow-up of her painful second metatarsophalangeal joint of the left foot.  States that the right foot is doing fine states has been bothering me some walking.  Objective: Vital signs stable alert oriented x 3 pain on palpation and range of motion of the second metatarsophalangeal joint of the left foot and medial deviation of the change.  Assessment: Capsulitis.  Plan: I injected 5 mg of Kenalog around the joint today with local anesthetic tolerated procedure well without complications would like to follow-up with her on an as-needed basis.

## 2022-08-14 ENCOUNTER — Ambulatory Visit (INDEPENDENT_AMBULATORY_CARE_PROVIDER_SITE_OTHER): Payer: Medicare Other | Admitting: Nurse Practitioner

## 2022-08-14 ENCOUNTER — Encounter (INDEPENDENT_AMBULATORY_CARE_PROVIDER_SITE_OTHER): Payer: Self-pay | Admitting: Vascular Surgery

## 2022-08-14 ENCOUNTER — Ambulatory Visit (INDEPENDENT_AMBULATORY_CARE_PROVIDER_SITE_OTHER): Payer: Medicare Other

## 2022-08-14 VITALS — BP 189/79 | HR 70 | Resp 18 | Ht 62.0 in | Wt 133.6 lb

## 2022-08-14 DIAGNOSIS — I739 Peripheral vascular disease, unspecified: Secondary | ICD-10-CM

## 2022-08-14 DIAGNOSIS — I70211 Atherosclerosis of native arteries of extremities with intermittent claudication, right leg: Secondary | ICD-10-CM

## 2022-08-15 LAB — VAS US ABI WITH/WO TBI
Left ABI: 1.15
Right ABI: 1.06

## 2022-08-18 ENCOUNTER — Encounter (INDEPENDENT_AMBULATORY_CARE_PROVIDER_SITE_OTHER): Payer: Self-pay | Admitting: Nurse Practitioner

## 2022-08-18 NOTE — Progress Notes (Signed)
Patient not seen today.  Ultrasound reviewed and patient should follow-up in 6 months with ABI.  No charge

## 2023-02-19 ENCOUNTER — Ambulatory Visit (INDEPENDENT_AMBULATORY_CARE_PROVIDER_SITE_OTHER): Payer: Medicare Other | Admitting: Vascular Surgery

## 2023-02-19 ENCOUNTER — Encounter (INDEPENDENT_AMBULATORY_CARE_PROVIDER_SITE_OTHER): Payer: Medicare Other

## 2023-03-25 ENCOUNTER — Other Ambulatory Visit (INDEPENDENT_AMBULATORY_CARE_PROVIDER_SITE_OTHER): Payer: Self-pay | Admitting: Nurse Practitioner

## 2023-03-25 DIAGNOSIS — I70211 Atherosclerosis of native arteries of extremities with intermittent claudication, right leg: Secondary | ICD-10-CM

## 2023-03-25 NOTE — Progress Notes (Deleted)
 MRN : [id number]  Elizabeth Li is a 69 y.o. (Feb 24, 1956) female who presents with chief complaint of check circulation.  History of Present Illness:   The patient returns to the office for followup and review status post angiogram with intervention on 11/15/2021.    Procedure:  Percutaneous transluminal angioplasty and stent placement right superficial femoral artery   The patient notes a big improvement in the lower extremity symptoms. No interval shortening of the patient's claudication distance or rest pain symptoms. No new ulcers or wounds have occurred since the last visit.   There have been no significant changes to the patient's overall health care.   No documented history of amaurosis fugax or recent TIA symptoms. There are no recent neurological changes noted. No documented history of DVT, PE or superficial thrombophlebitis. The patient denies recent episodes of angina or shortness of breath.    ABI's Rt=1.09 and Lt=1.1.08  (previous ABI's Rt=1.18 and Lt=1.04) Duplex ultrasound of the right lower extremity arterial system shows a widely patent arterial system no hemodynamically significant restenosis within the SFA stent  No outpatient medications have been marked as taking for the 03/26/23 encounter (Appointment) with Jama, Cordella MATSU, MD.    Past Medical History:  Diagnosis Date   Arthritis    Benign essential HTN 11/20/2016   Chest pain 11/20/2016   Diverticulitis    History of frequent urinary tract infections    Hypercholesterolemia    Hyperlipidemia 11/20/2016   Hypertension    Wears glasses     Past Surgical History:  Procedure Laterality Date   COLONOSCOPY  07/15/2008   polyps-done in [place]    COLONOSCOPY N/A 11/24/2014   Dr. Shaaron: colonic diverticulosis, melanosis coli, surveillance in 2021   FOOT SURGERY     LAPAROSCOPIC PARTIAL COLECTOMY N/A 10/08/2015   Procedure: LAPAROSCOPIC  PARTIAL COLECTOMY;  Surgeon: Morene Olives, MD;  Location: [facility];  Service: General;  Laterality: N/A;   LOWER EXTREMITY ANGIOGRAPHY Right 11/15/2021   Procedure: Lower Extremity Angiography;  Surgeon: Jama Cordella MATSU, MD;  Location: [facility];  Service: Cardiovascular;  Laterality: Right;   TONSILLECTOMY     TRIGGER FINGER RELEASE Right 02/27/2015   Procedure: RELEASE TRIGGER FINGER/A-1 PULLEY RIGHT LONG FINGER;  Surgeon: Franky Curia, MD;  Location: [facility];  Service: Orthopedics;  Laterality: Right;    Social History Social History   Tobacco Use   Smoking status: Never   Smokeless tobacco: Never  Substance Use Topics   Alcohol use: Yes    Alcohol/week: 3.0 standard drinks of alcohol    Types: 3 Glasses of wine per week    Comment: 3-4 per week   Drug use: No    Family History Family History  Problem Relation Age of Onset   Arthritis Mother    Heart disease Mother    Arthritis Father    Heart disease Father    CVA Father    CAD Father        multi vessel   Heart disease Brother    Colon cancer Neg Hx     Allergies  Allergen Reactions   Flagyl [Metronidazole] Other (See Comments)    Causes weakness and dizziness      REVIEW OF SYSTEMS (Negative unless checked)  Constitutional: [] Weight loss  [] Fever  [] Chills Cardiac: [] Chest pain   [] Chest pressure   [] Palpitations   [] Shortness of breath when laying flat   [] Shortness of breath with exertion. Vascular:  [x] Pain in legs with walking   [] Pain in legs at rest  [] History of DVT   [] Phlebitis   [] Swelling in legs   [] Varicose veins   [] Non-healing ulcers Pulmonary:   [] Uses home oxygen   [] Productive cough   [] Hemoptysis   [] Wheeze  [] COPD   [] Asthma Neurologic:  [] Dizziness   [] Seizures   [] History of stroke   [] History of TIA  [] Aphasia   [] Vissual changes   [] Weakness or numbness in arm   [] Weakness or numbness in leg Musculoskeletal:   [] Joint swelling   [] Joint pain   [] Low back  pain Hematologic:  [] Easy bruising  [] Easy bleeding   [] Hypercoagulable state   [] Anemic Gastrointestinal:  [] Diarrhea   [] Vomiting  [x] Gastroesophageal reflux/heartburn   [] Difficulty swallowing. Genitourinary:  [] Chronic kidney disease   [] Difficult urination  [] Frequent urination   [] Blood in urine Skin:  [] Rashes   [] Ulcers  Psychological:  [] History of anxiety   []  History of major depression.  Physical Examination  There were no vitals filed for this visit. There is no height or weight on file to calculate BMI. Gen: WD/WN, NAD Head: [place]/AT, No temporalis wasting.  Ear/Nose/Throat: Hearing grossly intact, nares w/o erythema or drainage Eyes: PER, EOMI, sclera nonicteric.  Neck: Supple, no masses.  No bruit or JVD.  Pulmonary:  Good air movement, no audible wheezing, no use of accessory muscles.  Cardiac: RRR, normal S1, S2, no Murmurs. Vascular:  mild trophic changes, no open wounds Vessel Right Left  Radial Palpable Palpable  PT Not Palpable Not Palpable  DP Not Palpable Not Palpable  Gastrointestinal: soft, non-distended. No guarding/no peritoneal signs.  Musculoskeletal: M/S 5/5 throughout.  No visible deformity.  Neurologic: CN 2-12 intact. Pain and light touch intact in extremities.  Symmetrical.  Speech is fluent. Motor exam as listed above. Psychiatric: Judgment intact, Mood & affect appropriate for pt's clinical situation. Dermatologic: No rashes or ulcers noted.  No changes consistent with cellulitis.   CBC Lab Results  Component Value Date   WBC 6.6 10/16/2018   HGB 14.1 10/16/2018   HCT 40.5 10/16/2018   MCV 88.6 10/16/2018   PLT 290 10/16/2018    BMET    Component Value Date/Time   NA 136 10/17/2018 0821   K 4.0 10/17/2018 0821   CL 103 10/17/2018 0821   CO2 24 10/17/2018 0821   GLUCOSE 113 (H) 10/17/2018 0821   BUN 18 11/15/2021 1207   CREATININE 0.52 11/15/2021 1207   CALCIUM 9.2 10/17/2018 0821   GFRNONAA >60 11/15/2021 1207   GFRAA >60  10/17/2018 0821   CrCl cannot be calculated (Patient's most recent lab result is older than the maximum 21 days allowed.).  COAG No results found for: INR, PROTIME  Radiology No results found.   Assessment/Plan There are no diagnoses linked to this encounter.   Cordella Shawl, MD  03/25/2023 7:42 AM

## 2023-03-26 ENCOUNTER — Ambulatory Visit (INDEPENDENT_AMBULATORY_CARE_PROVIDER_SITE_OTHER): Payer: Medicare Other | Admitting: Vascular Surgery

## 2023-03-26 ENCOUNTER — Ambulatory Visit (INDEPENDENT_AMBULATORY_CARE_PROVIDER_SITE_OTHER): Payer: Medicare Other

## 2023-03-26 DIAGNOSIS — E782 Mixed hyperlipidemia: Secondary | ICD-10-CM

## 2023-03-26 DIAGNOSIS — I70211 Atherosclerosis of native arteries of extremities with intermittent claudication, right leg: Secondary | ICD-10-CM | POA: Diagnosis not present

## 2023-03-26 DIAGNOSIS — M48061 Spinal stenosis, lumbar region without neurogenic claudication: Secondary | ICD-10-CM

## 2023-03-26 DIAGNOSIS — I70213 Atherosclerosis of native arteries of extremities with intermittent claudication, bilateral legs: Secondary | ICD-10-CM

## 2023-03-26 DIAGNOSIS — I10 Essential (primary) hypertension: Secondary | ICD-10-CM

## 2023-03-27 LAB — VAS US ABI WITH/WO TBI
Left ABI: 1.08
Right ABI: 1.08

## 2023-05-11 ENCOUNTER — Other Ambulatory Visit (HOSPITAL_COMMUNITY): Payer: Self-pay | Admitting: Family Medicine

## 2023-05-11 DIAGNOSIS — Z1231 Encounter for screening mammogram for malignant neoplasm of breast: Secondary | ICD-10-CM

## 2023-06-09 ENCOUNTER — Other Ambulatory Visit: Payer: Self-pay | Admitting: Physical Medicine and Rehabilitation

## 2023-06-09 ENCOUNTER — Ambulatory Visit
Admission: RE | Admit: 2023-06-09 | Discharge: 2023-06-09 | Disposition: A | Discharge status: Home / Self Care | Source: Ambulatory Visit | Attending: Physical Medicine and Rehabilitation | Admitting: Physical Medicine and Rehabilitation

## 2023-06-09 DIAGNOSIS — M542 Cervicalgia: Secondary | ICD-10-CM

## 2023-06-10 ENCOUNTER — Encounter: Payer: Self-pay | Admitting: Physical Medicine and Rehabilitation

## 2023-06-10 DIAGNOSIS — M542 Cervicalgia: Secondary | ICD-10-CM

## 2023-06-25 ENCOUNTER — Ambulatory Visit (HOSPITAL_COMMUNITY): Payer: Medicare Other

## 2023-06-25 ENCOUNTER — Ambulatory Visit (HOSPITAL_COMMUNITY)
Admission: RE | Admit: 2023-06-25 | Discharge: 2023-06-25 | Disposition: A | Discharge status: Home / Self Care | Source: Ambulatory Visit | Attending: Family Medicine | Admitting: Family Medicine

## 2023-06-25 ENCOUNTER — Encounter (HOSPITAL_COMMUNITY): Payer: Self-pay

## 2023-06-25 DIAGNOSIS — Z1231 Encounter for screening mammogram for malignant neoplasm of breast: Secondary | ICD-10-CM | POA: Insufficient documentation

## 2023-07-08 ENCOUNTER — Encounter: Payer: Self-pay | Admitting: Podiatry

## 2023-07-08 ENCOUNTER — Ambulatory Visit (INDEPENDENT_AMBULATORY_CARE_PROVIDER_SITE_OTHER): Admitting: Podiatry

## 2023-07-08 ENCOUNTER — Ambulatory Visit (INDEPENDENT_AMBULATORY_CARE_PROVIDER_SITE_OTHER)

## 2023-07-08 DIAGNOSIS — G5782 Other specified mononeuropathies of left lower limb: Secondary | ICD-10-CM

## 2023-07-08 DIAGNOSIS — M2042 Other hammer toe(s) (acquired), left foot: Secondary | ICD-10-CM

## 2023-07-08 DIAGNOSIS — G5762 Lesion of plantar nerve, left lower limb: Secondary | ICD-10-CM | POA: Diagnosis not present

## 2023-07-08 NOTE — Progress Notes (Signed)
 She presents today chief concern of a painful fourth toe of her left foot.  States that she had a neurectomy done about 30 years ago to the third interdigital space.  She states that the toe itself sort of hurts right and here she points to the intermetatarsal space and interdigital space.  Objective: Vital signs stable alert oriented x 3.  There is no erythema Dem salines drainage or odor she does have a palpable Mulder's click to the third interdigital space of the left foot.  She also has radiographs demonstrating an osseously mature foot with a mild diastases and what appears to be some intracapsular calcification.  Assessment: Neuroma third interdigital space left.  Plan follow-up: I injected the area today 10 mg of Kenalog  5 mg of Marcaine .  And I will follow-up with her on an as-needed basis.

## 2023-07-09 DIAGNOSIS — G5762 Lesion of plantar nerve, left lower limb: Secondary | ICD-10-CM | POA: Diagnosis not present

## 2023-07-09 DIAGNOSIS — G5782 Other specified mononeuropathies of left lower limb: Secondary | ICD-10-CM | POA: Diagnosis not present

## 2023-07-09 MED ORDER — TRIAMCINOLONE ACETONIDE 40 MG/ML IJ SUSP
20.0000 mg | Freq: Once | INTRAMUSCULAR | Status: AC
  Administered 2023-07-09: 20 mg

## 2023-07-09 NOTE — Addendum Note (Signed)
 Addended by: Sanda Crome on: 07/09/2023 08:26 AM   Modules accepted: Orders, Level of Service

## 2023-08-04 ENCOUNTER — Encounter (INDEPENDENT_AMBULATORY_CARE_PROVIDER_SITE_OTHER): Payer: Self-pay

## 2023-10-05 NOTE — Progress Notes (Signed)
 HISTORY OF PRESENT ILLNESS  Elizabeth Li is a 68 y.o. female who presents today with long-term left shoulder pain.  She reports she has had problems with her shoulder going back for over 20 years.  She has not had any previous surgery.  The patient reports pain with simple tasks of daily living including reaching, lifting, dressing, sleeping.  She has been seeing a chiropractor who has helped her to some degree.  She also had a cortisone injection placed which helped for just 1 to 2 weeks only and then the injection wore off.  She is having positional pain as well as night pain.  She has had 1 opinion for a shoulder placement by another physician.  MRI and x-rays have been done.  PHYSICAL EXAMINATION  Examination of the left shoulder shows healthy skin.  No surgical scar.  Her motion shows external rotation of about 50 degrees, elevation of 160 degrees, abduction of 90 degrees.  Internal rotation is to about the L4 level.  Crepitus with motion.  Strength 4+ out of 5, limited by pain.  IMAGING  Outside imaging evaluated showing complete loss of glenohumeral joint space with severe glenohumeral osteoarthritis.  Partial fraying of rotator cuff but no full-thickness tear  IMPRESSION  Left shoulder osteoarthritis, largely intact rotator cuff  PLAN  We discussed the current diagnosis at length today.  We discussed her case at length.  She has already had 1 injection without improvement.  She is also had physical therapy and chiropractic care.  I think she may be interested in replacement.  We discussed that surgery at length including the recovery and outcomes.  She will return for recheck in the wintertime and possibly consider surgery after the first of the year.  We will want a get plain x-rays on return.
